# Patient Record
Sex: Female | Born: 1935 | Race: White | Hispanic: No | Smoking: Never smoker
Health system: Southern US, Community
[De-identification: ages and names within clinical notes are randomized; demographics above are authoritative.]

## PROBLEM LIST (undated history)

## (undated) DIAGNOSIS — R197 Diarrhea, unspecified: Secondary | ICD-10-CM

## (undated) DIAGNOSIS — Z8679 Personal history of other diseases of the circulatory system: Secondary | ICD-10-CM

## (undated) DIAGNOSIS — L989 Disorder of the skin and subcutaneous tissue, unspecified: Secondary | ICD-10-CM

## (undated) DIAGNOSIS — Z9889 Other specified postprocedural states: Secondary | ICD-10-CM

## (undated) HISTORY — DX: Other specified postprocedural states: Z86.79

## (undated) HISTORY — DX: Other specified postprocedural states: Z98.890

## (undated) HISTORY — DX: Disorder of the skin and subcutaneous tissue, unspecified: L98.9

## (undated) HISTORY — PX: CATARACT EXTRACTION: SUR2

## (undated) HISTORY — DX: Diarrhea, unspecified: R19.7

## (undated) HISTORY — PX: CHOLECYSTECTOMY: SHX55

---

## 1999-10-03 ENCOUNTER — Inpatient Hospital Stay (HOSPITAL_COMMUNITY): Admission: EM | Admit: 1999-10-03 | Discharge: 1999-10-04 | Payer: Self-pay | Admitting: Internal Medicine

## 1999-10-04 ENCOUNTER — Encounter: Payer: Self-pay | Admitting: Internal Medicine

## 2004-05-06 ENCOUNTER — Ambulatory Visit: Payer: Self-pay | Admitting: Pediatrics

## 2004-06-04 ENCOUNTER — Ambulatory Visit: Payer: Self-pay | Admitting: Obstetrics and Gynecology

## 2004-06-04 ENCOUNTER — Other Ambulatory Visit: Payer: Self-pay

## 2004-06-13 ENCOUNTER — Ambulatory Visit: Payer: Self-pay | Admitting: Obstetrics and Gynecology

## 2006-12-01 ENCOUNTER — Ambulatory Visit: Payer: Self-pay | Admitting: Unknown Physician Specialty

## 2009-01-24 ENCOUNTER — Ambulatory Visit: Payer: Self-pay

## 2009-02-15 ENCOUNTER — Ambulatory Visit: Payer: Self-pay | Admitting: Unknown Physician Specialty

## 2009-02-22 ENCOUNTER — Ambulatory Visit: Payer: Self-pay | Admitting: Unknown Physician Specialty

## 2009-11-09 LAB — HM MAMMOGRAPHY: HM Mammogram: NORMAL

## 2009-11-14 ENCOUNTER — Ambulatory Visit: Payer: Self-pay | Admitting: Internal Medicine

## 2009-12-21 ENCOUNTER — Emergency Department: Payer: Self-pay | Admitting: Internal Medicine

## 2010-02-09 LAB — HM COLONOSCOPY: HM Colonoscopy: ABNORMAL

## 2010-02-14 ENCOUNTER — Other Ambulatory Visit: Payer: Self-pay | Admitting: Physician Assistant

## 2010-03-17 ENCOUNTER — Ambulatory Visit: Payer: Self-pay | Admitting: Unknown Physician Specialty

## 2010-12-11 ENCOUNTER — Encounter: Payer: Self-pay | Admitting: Internal Medicine

## 2010-12-11 ENCOUNTER — Ambulatory Visit: Payer: Self-pay | Admitting: Internal Medicine

## 2010-12-11 ENCOUNTER — Ambulatory Visit (INDEPENDENT_AMBULATORY_CARE_PROVIDER_SITE_OTHER): Payer: Medicare Other | Admitting: Internal Medicine

## 2010-12-11 VITALS — BP 126/67 | HR 61 | Temp 98.2°F | Resp 12 | Ht 66.5 in | Wt 159.0 lb

## 2010-12-11 DIAGNOSIS — K529 Noninfective gastroenteritis and colitis, unspecified: Secondary | ICD-10-CM | POA: Insufficient documentation

## 2010-12-11 DIAGNOSIS — R1031 Right lower quadrant pain: Secondary | ICD-10-CM

## 2010-12-11 DIAGNOSIS — R197 Diarrhea, unspecified: Secondary | ICD-10-CM

## 2010-12-11 LAB — COMPREHENSIVE METABOLIC PANEL
ALT: 16 U/L (ref 0–35)
AST: 20 U/L (ref 0–37)
Albumin: 3.8 g/dL (ref 3.5–5.2)
CO2: 29 mEq/L (ref 19–32)
Calcium: 9 mg/dL (ref 8.4–10.5)
Chloride: 105 mEq/L (ref 96–112)
GFR: 68.77 mL/min (ref >60.00)
Potassium: 4.1 mEq/L (ref 3.5–5.1)
Sodium: 142 mEq/L (ref 135–145)
Total Protein: 6.9 g/dL (ref 6.0–8.3)

## 2010-12-11 LAB — CBC WITH DIFFERENTIAL/PLATELET
Basophils Absolute: 0 10*3/uL (ref 0.0–0.1)
Eosinophils Absolute: 0.1 10*3/uL (ref 0.0–0.7)
Hemoglobin: 13.1 g/dL (ref 12.0–15.0)
Lymphocytes Relative: 28.5 % (ref 12.0–46.0)
MCHC: 33.8 g/dL (ref 30.0–36.0)
Monocytes Relative: 6.9 % (ref 3.0–12.0)
Neutro Abs: 3.7 10*3/uL (ref 1.4–7.7)
Neutrophils Relative %: 63.4 % (ref 43.0–77.0)
Platelets: 184 10*3/uL (ref 150.0–400.0)
RDW: 12.9 % (ref 11.5–14.6)

## 2010-12-11 NOTE — Patient Instructions (Signed)
Labs and CT today. We will call with results. Return in 1 month.

## 2010-12-11 NOTE — Progress Notes (Signed)
Subjective:    Patient ID: Hannah Santos, female    DOB: November 07, 1930, 77 y.o.   MRN: [id number]  Hannah Santos is a 77 year old female who presents to establish care. Her primary concern today is severe right-sided abdominal pain. Abdominal Pain This is a new problem. The current episode started in the past 7 days. The onset quality is gradual. The problem occurs constantly. The problem has been gradually worsening. The pain is located in the RLQ and periumbilical region. The pain is severe. The quality of the pain is aching and colicky. The abdominal pain radiates to the RUQ. Associated symptoms include anorexia and constipation. Pertinent negatives include no arthralgias, diarrhea, dysuria, fever, frequency, headaches, hematochezia, hematuria, melena, myalgias, nausea or vomiting. It is movement what aggravates the pain. The pain is relieved by nothing. She has tried antacids and proton pump inhibitors for the symptoms. The treatment provided no relief. 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 acetaminophen (TYLENOL) 500 MG tablet Take 500 mg by mouth every 6 (six) hours as needed. 2 tablets       . B Complex-C (B-COMPLEX WITH VITAMIN C) tablet Take 1 tablet by mouth daily.        . budesonide (ENTOCORT EC) 3 MG 24 hr capsule Take 3 mg by mouth every morning.        . Cholecalciferol (VITAMIN D3) 1000 UNITS CAPS Take 1 tablet by mouth daily.        Marland Kitchen GLUCOSAMINE-CHONDROITIN PO Take 1 tablet by mouth daily.        Marland Kitchen loratadine (CLARITIN) 10 MG tablet Take 10 mg by mouth daily as needed.        . Omega-3 Fatty Acids (FISH OIL) 1200 MG CAPS Take 1 capsule by mouth daily.        . Simethicone (GAS RELIEF PO) Take 2 tablets by mouth as needed.        Marland Kitchen EVISTA 60 MG tablet       . venlafaxine (EFFEXOR-XR) 150 MG 24 hr capsule         Review of Systems  Constitutional: Positive for fatigue. Negative for  fever, chills, appetite change and unexpected weight change.  HENT: Negative for ear pain, congestion, sore throat, trouble swallowing, neck pain, voice change and sinus pressure.   Eyes: Negative for visual disturbance.  Respiratory: Negative for cough, shortness of breath, wheezing and stridor.   Cardiovascular: Negative for chest pain, palpitations and leg swelling.  Gastrointestinal: Positive for abdominal pain, constipation and anorexia. Negative for nausea, vomiting, diarrhea, blood in stool, melena, hematochezia, abdominal distention and anal bleeding.  Genitourinary: Negative for dysuria, frequency, hematuria and flank pain.  Musculoskeletal: Negative for myalgias, arthralgias and gait problem.  Skin: Negative for color change and rash.  Neurological: Negative for dizziness and headaches.  Hematological: Negative for adenopathy. Does not bruise/bleed easily.  Psychiatric/Behavioral: Negative for suicidal ideas, sleep disturbance and dysphoric mood. The patient is not nervous/anxious.      BP 126/67  Pulse 61  Temp(Src) 98.2 F (36.8 C) (Oral)  Resp 12  Ht 5' 6.5" (1.689 m)  Wt 159 lb (72.122 kg)  BMI 25.28 kg/m2  SpO2 99%     Objective:   Physical Exam  Constitutional: She is oriented to person, place, and time. She appears well-developed and well-nourished. No distress.  HENT:  Head: Normocephalic and atraumatic.  Right Ear: External ear normal.  Left  Ear: External ear normal.  Nose: Nose normal.  Mouth/Throat: Oropharynx is clear and moist. No oropharyngeal exudate.  Eyes: Conjunctivae are normal. Pupils are equal, round, and reactive to light. Right eye exhibits no discharge. Left eye exhibits no discharge. No scleral icterus.  Neck: Normal range of motion. Neck supple. No tracheal deviation present. No thyromegaly present.  Cardiovascular: Normal rate, regular rhythm, normal heart sounds and intact distal pulses.  Exam reveals no gallop and no friction rub.   No  murmur heard. Pulmonary/Chest: Effort normal and breath sounds normal. No respiratory distress. She has no wheezes. She has no rales. She exhibits no tenderness.  Abdominal: Soft. Bowel sounds are normal. She exhibits no distension. There is no hepatosplenomegaly. There is tenderness in the right lower quadrant and periumbilical area. There is rebound, guarding and tenderness at McBurney's point.         Area of tenderness and guarding noted by red circle  Musculoskeletal: Normal range of motion. She exhibits no edema and no tenderness.  Lymphadenopathy:    She has no cervical adenopathy.  Neurological: She is alert and oriented to person, place, and time. No cranial nerve deficit. She exhibits normal muscle tone. Coordination normal.  Skin: Skin is warm and dry. No rash noted. She is not diaphoretic. No erythema. No pallor.  Psychiatric: She has a normal mood and affect. Her behavior is normal. Judgment and thought content normal.          Assessment & Plan:  1. Abdominal pain - abdominal pain and exam findings are concerning for acute appendicitis. We Santos obtain a CBC and CMP with labs today. Patient Santos go to the hospital to have a CT abdomen and pelvis. We Santos call her with results. We Santos obtain records from Dr. Earnest Conroy office in regards to her chronic diarrhea.

## 2010-12-23 ENCOUNTER — Encounter: Payer: Self-pay | Admitting: Internal Medicine

## 2011-01-06 ENCOUNTER — Ambulatory Visit: Payer: Self-pay | Admitting: Ophthalmology

## 2011-01-12 ENCOUNTER — Ambulatory Visit (INDEPENDENT_AMBULATORY_CARE_PROVIDER_SITE_OTHER): Payer: Medicare Other | Admitting: Internal Medicine

## 2011-01-12 ENCOUNTER — Encounter: Payer: Self-pay | Admitting: Internal Medicine

## 2011-01-12 VITALS — BP 132/80 | HR 70 | Temp 97.7°F | Resp 12 | Ht 66.5 in | Wt 156.5 lb

## 2011-01-12 DIAGNOSIS — F03918 Unspecified dementia, unspecified severity, with other behavioral disturbance: Secondary | ICD-10-CM | POA: Insufficient documentation

## 2011-01-12 DIAGNOSIS — F0391 Unspecified dementia with behavioral disturbance: Secondary | ICD-10-CM | POA: Insufficient documentation

## 2011-01-12 DIAGNOSIS — R197 Diarrhea, unspecified: Secondary | ICD-10-CM

## 2011-01-12 DIAGNOSIS — E785 Hyperlipidemia, unspecified: Secondary | ICD-10-CM | POA: Insufficient documentation

## 2011-01-12 DIAGNOSIS — K529 Noninfective gastroenteritis and colitis, unspecified: Secondary | ICD-10-CM

## 2011-01-12 DIAGNOSIS — R413 Other amnesia: Secondary | ICD-10-CM

## 2011-01-12 NOTE — Progress Notes (Signed)
Subjective:    Patient ID: Hannah Santos, female    DOB: [birth year], 77 y.o.   MRN: [id number]  HPI 77YO female presents for follow up. Was recently evaluated for abdominal pain with CT scan, which was normal. Reports abdominal pain hs resolved. Diarrhea improved, however still some loose stools consistent with her baseline. She is primarily concerned today about recent issues with short term memory. Reports difficulty recalling names, events which have recently occurred. No focal neurologic symptoms. First noted memory loss after anesthesia for surgery several years ago.  Outpatient Encounter Prescriptions as of 01/12/2011  Medication Sig Dispense Refill  . acetaminophen (TYLENOL) 500 MG tablet Take 500 mg by mouth every 6 (six) hours as needed. 2 tablets       . B Complex-C (B-COMPLEX WITH VITAMIN C) tablet Take 1 tablet by mouth daily.        . budesonide (ENTOCORT EC) 3 MG 24 hr capsule Take 3 mg by mouth every morning.        . Cholecalciferol (VITAMIN D3) 1000 UNITS CAPS Take 1 tablet by mouth daily.        . DUREZOL 0.05 % EMUL Place 0.5 drops into the left eye 3 (three) times daily.       Marland Kitchen EVISTA 60 MG tablet Take 60 mg by mouth daily.       Marland Kitchen GLUCOSAMINE-CHONDROITIN PO Take 1 tablet by mouth daily.        Marland Kitchen loratadine (CLARITIN) 10 MG tablet Take 10 mg by mouth daily as needed.        Marland Kitchen NEVANAC 0.1 % ophthalmic suspension Place 1 drop into the left eye 3 (three) times daily.       . Omega-3 Fatty Acids (FISH OIL) 1200 MG CAPS Take 1 capsule by mouth daily.        . Simethicone (GAS RELIEF PO) Take 2 tablets by mouth as needed.        . venlafaxine (EFFEXOR-XR) 150 MG 24 hr capsule Take 150 mg by mouth daily.       Marland Kitchen VIGAMOX 0.5 % ophthalmic solution Place 1 drop into the left eye 3 (three) times daily.         Review of Systems  Constitutional: Positive for fatigue. Negative for fever, chills, appetite change and unexpected weight change.  HENT: Negative for ear pain,  congestion, sore throat, trouble swallowing, neck pain, voice change and sinus pressure.   Eyes: Negative for visual disturbance.  Respiratory: Negative for cough, shortness of breath, wheezing and stridor.   Cardiovascular: Negative for chest pain, palpitations and leg swelling.  Gastrointestinal: Positive for diarrhea (chronic). Negative for nausea, vomiting, abdominal pain, constipation, blood in stool, abdominal distention and anal bleeding.  Genitourinary: Negative for dysuria and flank pain.  Musculoskeletal: Negative for myalgias, arthralgias and gait problem.  Skin: Negative for color change and rash.  Neurological: Negative for dizziness and headaches.  Hematological: Negative for adenopathy. Does not bruise/bleed easily.  Psychiatric/Behavioral: Positive for decreased concentration. Negative for suicidal ideas, sleep disturbance and dysphoric mood. The patient is not nervous/anxious.    BP 132/80  Pulse 70  Temp(Src) 97.7 F (36.5 C) (Oral)  Resp 12  Ht 5' 6.5" (1.689 m)  Wt 156 lb 8 oz (70.988 kg)  BMI 24.88 kg/m2  SpO2 100%     Objective:   Physical Exam  Constitutional: She is oriented to person, place, and time. She appears well-developed and well-nourished. No distress.  HENT:  Head: Normocephalic and atraumatic.  Right Ear: External ear normal.  Left Ear: External ear normal.  Nose: Nose normal.  Mouth/Throat: Oropharynx is clear and moist. No oropharyngeal exudate.  Eyes: Conjunctivae are normal. Pupils are equal, round, and reactive to light. Right eye exhibits no discharge. Left eye exhibits no discharge. No scleral icterus.  Neck: Normal range of motion. Neck supple. No tracheal deviation present. No thyromegaly present.  Cardiovascular: Normal rate, regular rhythm, normal heart sounds and intact distal pulses.  Exam reveals no gallop and no friction rub.   No murmur heard. Pulmonary/Chest: Effort normal and breath sounds normal. No respiratory distress. She  has no wheezes. She has no rales. She exhibits no tenderness.  Abdominal: Soft. Bowel sounds are normal. She exhibits no distension and no mass. There is no tenderness. There is no rebound and no guarding.  Musculoskeletal: Normal range of motion. She exhibits no edema and no tenderness.  Lymphadenopathy:    She has no cervical adenopathy.  Neurological: She is alert and oriented to person, place, and time. No cranial nerve deficit. She exhibits normal muscle tone. Coordination normal.  Skin: Skin is warm and dry. No rash noted. She is not diaphoretic. No erythema. No pallor.  Psychiatric: She has a normal mood and affect. Judgment and thought content normal. Her speech is delayed. She is slowed. She exhibits abnormal recent memory.          Assessment & Plan:  1. Memory loss - Will request cognitive testing with Dr. Letta Moynahan. Will check TSH and B12 with labs.  RTC in 1 month.  2. Abdominal pain/chronic diarrhea - Improved, nearly resolved since last visit. Will request records on recent GI evaluation. Will continue budesonide. RTC 1 month.

## 2011-01-15 ENCOUNTER — Encounter: Payer: Self-pay | Admitting: Internal Medicine

## 2011-01-15 ENCOUNTER — Ambulatory Visit (INDEPENDENT_AMBULATORY_CARE_PROVIDER_SITE_OTHER): Payer: Medicare Other | Admitting: Internal Medicine

## 2011-01-15 ENCOUNTER — Other Ambulatory Visit: Payer: Medicare Other

## 2011-01-15 DIAGNOSIS — Z0000 Encounter for general adult medical examination without abnormal findings: Secondary | ICD-10-CM

## 2011-01-15 DIAGNOSIS — E785 Hyperlipidemia, unspecified: Secondary | ICD-10-CM

## 2011-01-15 DIAGNOSIS — R413 Other amnesia: Secondary | ICD-10-CM

## 2011-01-15 LAB — COMPREHENSIVE METABOLIC PANEL WITH GFR
ALT: 16 U/L (ref 0–35)
AST: 21 U/L (ref 0–37)
Albumin: 3.8 g/dL (ref 3.5–5.2)
Alkaline Phosphatase: 78 U/L (ref 39–117)
BUN: 10 mg/dL (ref 6–23)
CO2: 28 meq/L (ref 19–32)
Calcium: 9 mg/dL (ref 8.4–10.5)
Chloride: 106 meq/L (ref 96–112)
Creatinine, Ser: 0.8 mg/dL (ref 0.4–1.2)
GFR: 71.66 mL/min (ref >60.00)
Glucose, Bld: 78 mg/dL (ref 70–99)
Potassium: 3.9 meq/L (ref 3.5–5.1)
Sodium: 142 meq/L (ref 135–145)
Total Bilirubin: 0.5 mg/dL (ref 0.3–1.2)
Total Protein: 6.7 g/dL (ref 6.0–8.3)

## 2011-01-15 LAB — VITAMIN B12: Vitamin B-12: 290 pg/mL (ref 211–911)

## 2011-01-15 LAB — TSH: TSH: 1.33 u[IU]/mL (ref 0.35–5.50)

## 2011-01-15 LAB — LIPID PANEL
Cholesterol: 186 mg/dL (ref 0–200)
LDL Cholesterol: 105 mg/dL — ABNORMAL HIGH (ref 0–99)
Triglycerides: 137 mg/dL (ref 0.0–149.0)

## 2011-01-16 NOTE — Progress Notes (Signed)
Lab only 

## 2011-02-10 ENCOUNTER — Ambulatory Visit: Payer: Self-pay | Admitting: Ophthalmology

## 2011-02-16 ENCOUNTER — Encounter: Payer: Self-pay | Admitting: Internal Medicine

## 2011-02-16 ENCOUNTER — Ambulatory Visit: Payer: Medicare Other

## 2011-02-16 ENCOUNTER — Encounter: Payer: Medicare Other | Admitting: Internal Medicine

## 2011-02-16 ENCOUNTER — Ambulatory Visit (INDEPENDENT_AMBULATORY_CARE_PROVIDER_SITE_OTHER): Payer: Medicare Other | Admitting: Internal Medicine

## 2011-02-16 DIAGNOSIS — R0989 Other specified symptoms and signs involving the circulatory and respiratory systems: Secondary | ICD-10-CM

## 2011-02-16 DIAGNOSIS — Z0000 Encounter for general adult medical examination without abnormal findings: Secondary | ICD-10-CM

## 2011-02-16 DIAGNOSIS — D510 Vitamin B12 deficiency anemia due to intrinsic factor deficiency: Secondary | ICD-10-CM

## 2011-02-16 DIAGNOSIS — M1990 Unspecified osteoarthritis, unspecified site: Secondary | ICD-10-CM

## 2011-02-16 DIAGNOSIS — F329 Major depressive disorder, single episode, unspecified: Secondary | ICD-10-CM

## 2011-02-16 MED ORDER — CYANOCOBALAMIN 1000 MCG/ML IJ SOLN
1000.0000 ug | Freq: Once | INTRAMUSCULAR | Status: AC
  Administered 2011-02-16: 1000 ug via INTRAMUSCULAR

## 2011-02-16 MED ORDER — VENLAFAXINE HCL ER 150 MG PO CP24: 150.0000 mg | ORAL_CAPSULE | Freq: Every day | ORAL | Status: DC

## 2011-02-16 NOTE — Progress Notes (Signed)
Subjective:    Patient ID: Carolyne Fiscal, female    DOB: 11-10-1930, 77 y.o.   MRN: [id number]  HPI  The patient is here for annual Medicare wellness examination and management of other chronic and acute problems.   The risk factors are reflected in the social history.  The roster of all physicians providing medical care to patient - is listed in the Snapshot section of the chart.  Activities of daily living:  The patient is 100% inedpendent in all ADLs: dressing, toileting, feeding as well as independent mobility  Home safety : The patient has smoke detectors in the home. They wear seatbelts. Firearms are present in the home, kept in a safe fashion. There is no violence in the home.   There is no risks for hepatitis, STDs or HIV. There is no history of blood transfusion. They have no travel history to infectious disease endemic areas of the world.  The patient has seen their dentist in the last six month. They have seen their eye doctor in the last year. They admit to hearing difficulty and have not had audiologic testing in the last year.  They do not  have excessive sun exposure. Discussed the need for sun protection: hats, long sleeves and use of sunscreen if there is significant sun exposure.   Diet: the importance of a healthy diet is discussed. They do have a healthy diet.  The patient has a regular exercise program: Walk dog , 30-45 min duration, 7 day per week.  The benefits of regular aerobic exercise were discussed.  Depression screen: there are no signs or vegative symptoms of depression- irritability, change in appetite, anhedonia. Pt does note recent increase in sadness, decreased interest in spending time with friends.  Cognitive assessment: the patient manages all their financial and personal affairs and is actively engaged. They could relate day,date,year and events; recalled 3/3 objects at 3 minutes; performed clock-face test normally.  The following portions of the  patient's history were reviewed and updated as appropriate: allergies, current medications, past family history, past medical history,  past surgical history, past social history  and problem list.  Vision, hearing, body mass index were assessed and reviewed.   During the course of the visit the patient was educated and counseled about appropriate screening and preventive services including : fall prevention , diabetes screening, nutrition counseling, colorectal cancer screening, and recommended immunizations.    Review of Systems  Constitutional: Negative for fever, chills, appetite change, fatigue and unexpected weight change.  HENT: Negative for ear pain, congestion, sore throat, trouble swallowing, neck pain, voice change and sinus pressure.   Eyes: Negative for visual disturbance.  Respiratory: Negative for cough, shortness of breath, wheezing and stridor.   Cardiovascular: Negative for chest pain, palpitations and leg swelling.  Gastrointestinal: Negative for nausea, vomiting, abdominal pain, diarrhea, constipation, blood in stool, abdominal distention and anal bleeding.  Genitourinary: Negative for dysuria and flank pain.  Musculoskeletal: Positive for arthralgias. Negative for myalgias and gait problem.  Skin: Negative for color change and rash.  Neurological: Negative for dizziness and headaches.  Hematological: Negative for adenopathy. Does not bruise/bleed easily.  Psychiatric/Behavioral: Negative for suicidal ideas, sleep disturbance and dysphoric mood. The patient is not nervous/anxious.    BP 122/62  Pulse 87  Temp(Src) 98.4 F (36.9 C) (Oral)  Wt 156 lb (70.761 kg)  SpO2 99%     Objective:   Physical Exam  Constitutional: She is oriented to person, place, and time. She appears well-developed  and well-nourished. No distress.  HENT:  Head: Normocephalic and atraumatic.  Right Ear: External ear normal.  Left Ear: External ear normal.  Nose: Nose normal.  Mouth/Throat:  Oropharynx is clear and moist. No oropharyngeal exudate.  Eyes: Conjunctivae are normal. Pupils are equal, round, and reactive to light. Right eye exhibits no discharge. Left eye exhibits no discharge. No scleral icterus.  Neck: Normal range of motion. Neck supple. Carotid bruit is present (right). No tracheal deviation present. No thyromegaly present.  Cardiovascular: Normal rate, regular rhythm, normal heart sounds and intact distal pulses.  Exam reveals no gallop and no friction rub.   No murmur heard. Pulmonary/Chest: Effort normal and breath sounds normal. No respiratory distress. She has no wheezes. She has no rales. She exhibits no tenderness. Right breast exhibits no inverted nipple, no mass, no nipple discharge, no skin change and no tenderness. Left breast exhibits no inverted nipple, no mass, no nipple discharge, no skin change and no tenderness.    Musculoskeletal: She exhibits no edema and no tenderness.       Right knee: She exhibits decreased range of motion. tenderness found.       Left knee: She exhibits decreased range of motion.       Crepitus bilateral knees  Lymphadenopathy:    She has no cervical adenopathy.  Neurological: She is alert and oriented to person, place, and time. No cranial nerve deficit. She exhibits normal muscle tone. Coordination normal.  Skin: Skin is warm and dry. No rash noted. She is not diaphoretic. No erythema. No pallor.  Psychiatric: She has a normal mood and affect. Her behavior is normal. Judgment and thought content normal.          Assessment & Plan:  1. General exam - Exam normal. Will set up bone density and mammogram. Vaccinations are up to date.  Labwork up to date.  2. Right carotid bruit - Will set up carotid dopplers.  3. Osteoarthritis - Bilateral knees.  Will continue to monitor for now.  4. Pernicious anemia - Will start B12 injections weekly x3 weeks then monthly. Follow up 1 month to see if any improvement in symptoms.  5.  Depression - On effexor. Given recent worsening of symptoms, will supplement B12 as above. If no improvement, then will consider increasing effexor dose at next visit.  6. Osteoporosis -  Pt has been on Evista for years. Having trouble affording med. No recent bone density testing. Will set up bone density test.

## 2011-02-16 NOTE — Progress Notes (Signed)
Addended by: Vernie Murders on: 02/16/2011 06:38 PM   Modules accepted: Orders

## 2011-02-16 NOTE — Patient Instructions (Addendum)
Start weekly Vitamin B12 shots for next three weeks, then monthly. Follow up 1 month.  We will set up: 1. Mammogram 2. Bone Density testing 3. Ultrasound of carotids

## 2011-02-23 ENCOUNTER — Ambulatory Visit (INDEPENDENT_AMBULATORY_CARE_PROVIDER_SITE_OTHER): Payer: Medicare Other | Admitting: *Deleted

## 2011-02-23 DIAGNOSIS — D510 Vitamin B12 deficiency anemia due to intrinsic factor deficiency: Secondary | ICD-10-CM

## 2011-02-23 MED ORDER — CYANOCOBALAMIN 1000 MCG/ML IJ SOLN
1000.0000 ug | Freq: Once | INTRAMUSCULAR | Status: AC
  Administered 2011-02-23: 1000 ug via INTRAMUSCULAR

## 2011-03-02 ENCOUNTER — Ambulatory Visit (INDEPENDENT_AMBULATORY_CARE_PROVIDER_SITE_OTHER): Payer: Medicare Other | Admitting: *Deleted

## 2011-03-02 DIAGNOSIS — E538 Deficiency of other specified B group vitamins: Secondary | ICD-10-CM

## 2011-03-02 MED ORDER — CYANOCOBALAMIN 1000 MCG/ML IJ SOLN
1000.0000 ug | Freq: Once | INTRAMUSCULAR | Status: AC
  Administered 2011-03-02: 1000 ug via INTRAMUSCULAR

## 2011-03-09 ENCOUNTER — Ambulatory Visit (INDEPENDENT_AMBULATORY_CARE_PROVIDER_SITE_OTHER): Payer: Medicare Other | Admitting: *Deleted

## 2011-03-09 ENCOUNTER — Ambulatory Visit: Payer: Medicare Other

## 2011-03-09 DIAGNOSIS — D510 Vitamin B12 deficiency anemia due to intrinsic factor deficiency: Secondary | ICD-10-CM

## 2011-03-09 MED ORDER — CYANOCOBALAMIN 1000 MCG/ML IJ SOLN
1000.0000 ug | Freq: Once | INTRAMUSCULAR | Status: AC
  Administered 2011-03-09: 1000 ug via INTRAMUSCULAR

## 2011-03-16 ENCOUNTER — Ambulatory Visit: Payer: Medicare Other | Admitting: *Deleted

## 2011-03-16 DIAGNOSIS — E538 Deficiency of other specified B group vitamins: Secondary | ICD-10-CM

## 2011-03-16 MED ORDER — CYANOCOBALAMIN 1000 MCG/ML IJ SOLN
1000.0000 ug | Freq: Once | INTRAMUSCULAR | Status: AC
  Administered 2011-03-16: 1000 ug via INTRAMUSCULAR

## 2011-03-18 ENCOUNTER — Ambulatory Visit (INDEPENDENT_AMBULATORY_CARE_PROVIDER_SITE_OTHER): Payer: Medicare Other | Admitting: Internal Medicine

## 2011-03-18 ENCOUNTER — Encounter: Payer: Self-pay | Admitting: Internal Medicine

## 2011-03-18 DIAGNOSIS — D510 Vitamin B12 deficiency anemia due to intrinsic factor deficiency: Secondary | ICD-10-CM

## 2011-03-18 DIAGNOSIS — R413 Other amnesia: Secondary | ICD-10-CM

## 2011-03-18 DIAGNOSIS — F329 Major depressive disorder, single episode, unspecified: Secondary | ICD-10-CM

## 2011-03-18 DIAGNOSIS — M1990 Unspecified osteoarthritis, unspecified site: Secondary | ICD-10-CM | POA: Insufficient documentation

## 2011-03-18 NOTE — Progress Notes (Signed)
Subjective:    Patient ID: Hannah Santos, female    DOB: [birth year], 77 y.o.   MRN: [id number]  HPI 77 year old female with history of pernicious anemia, memory loss, depression, and osteoarthritis presents for followup. She notes some improvement in her memory loss with the use of B12 injections. She also notes some improvement in her energy level. She continues to suffer from depression. She notes significant problems within her family which have exacerbated her depression. She reports full compliance with her Effexor, and does not wish to increase the dose of this medication today. She denies suicidal ideation.  She is concerned today about recent worsening of her memory loss. She notes that she often encounters people in the community who recognize her and she does not recall their names. She is interested in proceeding with cognitive testing at some point in the next year. To date she has not had significant confusion or difficulty with activities of daily living such as bathing herself, driving, or preparing meals. She continues to manage her finances without difficulty.  She is also concerned today about osteoarthritis in her left knee. She was told by orthopedics that she needs a left knee replacement. She has deferred this until 2013. She has been using glucosamine with minimal improvement in her pain.  She did not have carotid Dopplers performed as scheduled after her last visit because she was confused about the time of the appointment.  Outpatient Encounter Prescriptions as of 03/18/2011  Medication Sig Dispense Refill  . acetaminophen (TYLENOL) 500 MG tablet Take 500 mg by mouth every 6 (six) hours as needed. 2 tablets       . B Complex-C (B-COMPLEX WITH VITAMIN C) tablet Take 1 tablet by mouth daily.        . budesonide (ENTOCORT EC) 3 MG 24 hr capsule Take 3 mg by mouth every morning.        . Cholecalciferol (VITAMIN D3) 1000 UNITS CAPS Take 1 tablet by mouth daily.        .  DUREZOL 0.05 % EMUL Place 0.5 drops into the left eye daily.       Marland Kitchen EVISTA 60 MG tablet Take 60 mg by mouth daily.       Marland Kitchen GLUCOSAMINE-CHONDROITIN PO Take 1 tablet by mouth daily.        Marland Kitchen loratadine (CLARITIN) 10 MG tablet Take 10 mg by mouth daily as needed.        . Omega-3 Fatty Acids (FISH OIL) 1200 MG CAPS Take 1 capsule by mouth daily.        . Simethicone (GAS RELIEF PO) Take 2 tablets by mouth as needed.        . venlafaxine (EFFEXOR-XR) 150 MG 24 hr capsule Take 1 capsule (150 mg total) by mouth daily.  30 capsule  6    Review of Systems  Constitutional: Negative for fever, chills, appetite change, fatigue and unexpected weight change.  HENT: Negative for ear pain, congestion, sore throat, trouble swallowing, neck pain, voice change and sinus pressure.   Eyes: Negative for visual disturbance.  Respiratory: Negative for cough, shortness of breath, wheezing and stridor.   Cardiovascular: Negative for chest pain, palpitations and leg swelling.  Gastrointestinal: Negative for nausea, vomiting, abdominal pain, diarrhea, constipation, blood in stool, abdominal distention and anal bleeding.  Genitourinary: Negative for dysuria and flank pain.  Musculoskeletal: Positive for myalgias, joint swelling (left knee) and arthralgias. Negative for gait problem.  Skin: Negative for color change and rash.  Neurological: Negative for dizziness and headaches.  Hematological: Negative for adenopathy. Does not bruise/bleed easily.  Psychiatric/Behavioral: Positive for confusion, dysphoric mood and decreased concentration. Negative for suicidal ideas and sleep disturbance. The patient is not nervous/anxious.    BP 116/60  Pulse 80  Temp(Src) 98 F (36.7 C) (Oral)  Wt 158 lb (71.668 kg)  SpO2 97%     Objective:   Physical Exam  Constitutional: She is oriented to person, place, and time. She appears well-developed and well-nourished. No distress.  HENT:  Head: Normocephalic and atraumatic.    Right Ear: External ear normal.  Left Ear: External ear normal.  Nose: Nose normal.  Mouth/Throat: Oropharynx is clear and moist. No oropharyngeal exudate.  Eyes: Conjunctivae are normal. Pupils are equal, round, and reactive to light. Right eye exhibits no discharge. Left eye exhibits no discharge. No scleral icterus.  Neck: Normal range of motion. Neck supple. Carotid bruit is present (right). No tracheal deviation present. No mass and no thyromegaly present.  Cardiovascular: Normal rate, regular rhythm, normal heart sounds and intact distal pulses.  Exam reveals no gallop and no friction rub.   No murmur heard. Pulmonary/Chest: Effort normal and breath sounds normal. No respiratory distress. She has no wheezes. She has no rales. She exhibits no tenderness.  Musculoskeletal: Normal range of motion. She exhibits no edema and no tenderness.  Lymphadenopathy:    She has no cervical adenopathy.  Neurological: She is alert and oriented to person, place, and time. No cranial nerve deficit. She exhibits normal muscle tone. Coordination normal.  Skin: Skin is warm and dry. No rash noted. She is not diaphoretic. No erythema. No pallor.  Psychiatric: She has a normal mood and affect. Her behavior is normal. Judgment and thought content normal.          Assessment & Plan:   1. Pernicious anemia - Symptoms improved with B12 injections. Will continue monthly injections. Follow up 06/2011.  2. Right carotid bruit - Will set up carotid dopplers. Note that patient missed her previous appointment.  3. Osteoarthritis - Bilateral knees.  Will continue to monitor for now.  4.Depression - On effexor. Pt would prefer not to make changes right now. Will continue to monitor.  5. Memory loss - likely multifactorial with B12 deficiency and depression. We discussed cognitive testing. Patient would like to pursue this early in 2013. We'll plan to have her followup then.

## 2011-03-24 ENCOUNTER — Ambulatory Visit: Payer: Self-pay | Admitting: Internal Medicine

## 2011-03-24 ENCOUNTER — Telehealth: Payer: Self-pay | Admitting: Internal Medicine

## 2011-03-24 NOTE — Telephone Encounter (Signed)
Bone density test shows osteoporosis. I would like to discuss treatment options with her at her next visit. Should be within one month

## 2011-03-25 NOTE — Telephone Encounter (Signed)
Patient informed and scheduled for OV next mth

## 2011-04-09 ENCOUNTER — Encounter: Payer: Self-pay | Admitting: Internal Medicine

## 2011-04-27 ENCOUNTER — Ambulatory Visit (INDEPENDENT_AMBULATORY_CARE_PROVIDER_SITE_OTHER): Payer: Medicare Other | Admitting: Internal Medicine

## 2011-04-27 ENCOUNTER — Encounter: Payer: Self-pay | Admitting: Internal Medicine

## 2011-04-27 VITALS — BP 124/68 | HR 90 | Temp 97.9°F | Wt 150.0 lb

## 2011-04-27 DIAGNOSIS — K5190 Ulcerative colitis, unspecified, without complications: Secondary | ICD-10-CM | POA: Insufficient documentation

## 2011-04-27 DIAGNOSIS — L989 Disorder of the skin and subcutaneous tissue, unspecified: Secondary | ICD-10-CM

## 2011-04-27 DIAGNOSIS — C44329 Squamous cell carcinoma of skin of other parts of face: Secondary | ICD-10-CM | POA: Insufficient documentation

## 2011-04-27 DIAGNOSIS — N390 Urinary tract infection, site not specified: Secondary | ICD-10-CM

## 2011-04-27 LAB — POCT URINALYSIS DIPSTICK
Bilirubin, UA: NEGATIVE
Glucose, UA: 250
Nitrite, UA: POSITIVE
Protein, UA: 30
Urobilinogen, UA: 1
pH, UA: 5

## 2011-04-27 MED ORDER — SULFAMETHOXAZOLE-TRIMETHOPRIM 800-160 MG PO TABS: 1.0000 | ORAL_TABLET | Freq: Two times a day (BID) | ORAL | Status: DC

## 2011-04-27 NOTE — Assessment & Plan Note (Signed)
With positive UA.  Will treat with Septra pending culture data.

## 2011-04-27 NOTE — Progress Notes (Signed)
Subjective:    Patient ID: Hannah Santos, female    DOB: Jan 05, 1931, 78 y.o.   MRN: [id number]  HPI Mrs. Rathe is a 78 year old white female with a history of ulcerative colitis who presents with a three-day history of dysuria accompanied by low back pain of one days duration she attempted to go to the ER for evaluation undertaken but the wait was too long. She's been taking over-the-counter analgesics with no appreciable change.   Past Medical History  Diagnosis Date  . Diarrhea     03/2010, chronic, followed by Dr. Markham Jordan, on budesonide  . Cataract   . S/P ablation of atrial fibrillation     [facility]  . Ulcerative colitis 2011    managed with Entocort, [place]  . Skin lesion of face     repeated frozen by [facility], keeps reappearing   Current Outpatient Prescriptions on File Prior to Visit  Medication Sig Dispense Refill  . acetaminophen (TYLENOL) 500 MG tablet Take 500 mg by mouth every 6 (six) hours as needed. 2 tablets       . B Complex-C (B-COMPLEX WITH VITAMIN C) tablet Take 1 tablet by mouth daily.        . budesonide (ENTOCORT EC) 3 MG 24 hr capsule Take 3 mg by mouth every morning.        . Cholecalciferol (VITAMIN D3) 1000 UNITS CAPS Take 1 tablet by mouth daily.        . DUREZOL 0.05 % EMUL Place 0.5 drops into the left eye daily.       Marland Kitchen EVISTA 60 MG tablet Take 60 mg by mouth daily.       Marland Kitchen GLUCOSAMINE-CHONDROITIN PO Take 1 tablet by mouth daily.        Marland Kitchen loratadine (CLARITIN) 10 MG tablet Take 10 mg by mouth daily as needed.        . Omega-3 Fatty Acids (FISH OIL) 1200 MG CAPS Take 1 capsule by mouth daily.        . Simethicone (GAS RELIEF PO) Take 2 tablets by mouth as needed.        . venlafaxine (EFFEXOR-XR) 150 MG 24 hr capsule Take 1 capsule (150 mg total) by mouth daily.  30 capsule  6        Review of Systems  Constitutional: Negative for fever, chills and unexpected weight change.  HENT: Negative for hearing loss, ear pain, nosebleeds,  congestion, sore throat, facial swelling, rhinorrhea, sneezing, mouth sores, trouble swallowing, neck pain, neck stiffness, voice change, postnasal drip, sinus pressure, tinnitus and ear discharge.   Eyes: Negative for pain, discharge, redness and visual disturbance.  Respiratory: Negative for cough, chest tightness, shortness of breath, wheezing and stridor.   Cardiovascular: Negative for chest pain, palpitations and leg swelling.  Musculoskeletal: Negative for myalgias and arthralgias.  Skin: Negative for color change and rash.  Neurological: Negative for dizziness, weakness, light-headedness and headaches.  Hematological: Negative for adenopathy.       Objective:   Physical Exam  Constitutional: She is oriented to person, place, and time. She appears well-developed and well-nourished.  HENT:  Mouth/Throat: Oropharynx is clear and moist.  Eyes: EOM are normal. Pupils are equal, round, and reactive to light. No scleral icterus.  Neck: Normal range of motion. Neck supple. No JVD present. No thyromegaly present.  Cardiovascular: Normal rate, regular rhythm, normal heart sounds and intact distal pulses.   Pulmonary/Chest: Effort normal and breath sounds normal.  Abdominal: Soft. Bowel sounds are normal.  She exhibits no mass. There is no tenderness.  Musculoskeletal: Normal range of motion. She exhibits no edema.  Lymphadenopathy:    She has no cervical adenopathy.  Neurological: She is alert and oriented to person, place, and time.  Skin: Skin is warm and dry.  Psychiatric: She has a normal mood and affect.          Assessment & Plan:

## 2011-04-27 NOTE — Assessment & Plan Note (Signed)
She is requesting a second opinion on a recurring lesion on her right cheek that has been frozen once.  Refer to Dr. Lin Givens.

## 2011-04-29 LAB — URINE CULTURE

## 2011-05-04 ENCOUNTER — Ambulatory Visit (INDEPENDENT_AMBULATORY_CARE_PROVIDER_SITE_OTHER): Payer: Medicare Other | Admitting: Internal Medicine

## 2011-05-04 ENCOUNTER — Encounter: Payer: Self-pay | Admitting: Internal Medicine

## 2011-05-04 VITALS — BP 124/50 | HR 86 | Temp 98.3°F | Ht 66.0 in | Wt 155.0 lb

## 2011-05-04 DIAGNOSIS — E538 Deficiency of other specified B group vitamins: Secondary | ICD-10-CM

## 2011-05-04 DIAGNOSIS — R143 Flatulence: Secondary | ICD-10-CM

## 2011-05-04 DIAGNOSIS — M810 Age-related osteoporosis without current pathological fracture: Secondary | ICD-10-CM

## 2011-05-04 DIAGNOSIS — R140 Abdominal distension (gaseous): Secondary | ICD-10-CM

## 2011-05-04 DIAGNOSIS — M1990 Unspecified osteoarthritis, unspecified site: Secondary | ICD-10-CM

## 2011-05-04 LAB — COMPREHENSIVE METABOLIC PANEL
ALT: 15 U/L (ref 0–35)
Albumin: 3.8 g/dL (ref 3.5–5.2)
CO2: 27 mEq/L (ref 19–32)
Chloride: 105 mEq/L (ref 96–112)
GFR: 57.61 mL/min — ABNORMAL LOW (ref >60.00)
Glucose, Bld: 59 mg/dL — ABNORMAL LOW (ref 70–99)
Potassium: 3.9 mEq/L (ref 3.5–5.1)
Sodium: 141 mEq/L (ref 135–145)
Total Protein: 6.9 g/dL (ref 6.0–8.3)

## 2011-05-04 MED ORDER — CYANOCOBALAMIN 1000 MCG/ML IJ SOLN
1000.0000 ug | Freq: Once | INTRAMUSCULAR | Status: AC
  Administered 2011-05-04: 1000 ug via INTRAMUSCULAR

## 2011-05-04 NOTE — Assessment & Plan Note (Signed)
Exam remarkable for crepitus in bilateral knees. Patient reports no improvement with steroid injection in the past. Question if she will need knee replacement. Will set up evaluation with orthopedic surgery.

## 2011-05-04 NOTE — Assessment & Plan Note (Signed)
Recent bone density testing showed T score of -4. We discussed potential options for treatment including bisphosphonates. She is not a good candidate for bisphosphonates because of ongoing GI issues. She would like to try Prolia. We will continue insurance coverage for this.

## 2011-05-04 NOTE — Progress Notes (Signed)
Subjective:    Patient ID: Hannah Santos, female    DOB: 03/30/1931, 78 y.o.   MRN: [id number]  HPI 78 year old female with history of chronic diarrhea, pernicious anemia , and osteoarthritis presents for followup. She was recently seen and evaluated for urinary tract infection. She reports that her symptoms have completely resolved. She denies any recent fever, chills, flank pain, dysuria, frequency.  In regards to her chronic diarrhea, she notes significant improvement with the use of budesonide. However, she has noticed some increased bloating and abdominal distention. She denies any abdominal pain. She denies any blood in her stool. She denies any nausea or vomiting. She has been discussing with her GI physician about tapering down on her use of budesonide because of the symptoms she experiences.  She is also concerned today about bilateral knee pain. She notes that she was told in the past that her right knee is bone-on-bone. She reports that she had steroid injections in the past but had no improvement with this. She is interested in talking to an orthopedic surgeon again about potential knee replacement.  She is also concerned today about recent bone density testing. Notably, her T score was -4 consistent with osteoporosis. She has not previously taken medications to help with bone density. She has not had any spontaneous fractures.  Outpatient Encounter Prescriptions as of 05/04/2011  Medication Sig Dispense Refill  . acetaminophen (TYLENOL) 500 MG tablet Take 500 mg by mouth every 6 (six) hours as needed. 2 tablets       . B Complex-C (B-COMPLEX WITH VITAMIN C) tablet Take 1 tablet by mouth daily.        . budesonide (ENTOCORT EC) 3 MG 24 hr capsule Take 3 mg by mouth every morning.        . Cholecalciferol (VITAMIN D3) 1000 UNITS CAPS Take 1 tablet by mouth daily.        . DUREZOL 0.05 % EMUL Place 0.5 drops into the left eye daily.       Marland Kitchen EVISTA 60 MG tablet Take 60 mg by mouth  daily.       Marland Kitchen GLUCOSAMINE-CHONDROITIN PO Take 1 tablet by mouth daily.        Marland Kitchen loratadine (CLARITIN) 10 MG tablet Take 10 mg by mouth daily as needed.        . Omega-3 Fatty Acids (FISH OIL) 1200 MG CAPS Take 1 capsule by mouth daily.        . Simethicone (GAS RELIEF PO) Take 2 tablets by mouth as needed.        . venlafaxine (EFFEXOR-XR) 150 MG 24 hr capsule Take 1 capsule (150 mg total) by mouth daily.  30 capsule  6  . DISCONTD: sulfamethoxazole-trimethoprim (SEPTRA DS) 800-160 MG per tablet Take 1 tablet by mouth 2 (two) times daily.  10 tablet  0   Facility-Administered Encounter Medications as of 05/04/2011  Medication Dose Route Frequency Provider Last Rate Last Dose  . cyanocobalamin ((VITAMIN B-12)) injection 1,000 mcg  1,000 mcg Intramuscular Once Shelia Media, MD   1,000 mcg at 05/04/11 1100    Review of Systems  Constitutional: Negative for fever, chills, appetite change, fatigue and unexpected weight change.  HENT: Negative for ear pain, congestion, sore throat, trouble swallowing, neck pain, voice change and sinus pressure.   Eyes: Negative for visual disturbance.  Respiratory: Negative for cough, shortness of breath, wheezing and stridor.   Cardiovascular: Negative for chest pain, palpitations and leg swelling.  Gastrointestinal: Positive for  abdominal pain and abdominal distention. Negative for nausea, vomiting, diarrhea, constipation, blood in stool and anal bleeding.  Genitourinary: Negative for dysuria and flank pain.  Musculoskeletal: Positive for myalgias and arthralgias. Negative for gait problem.  Skin: Negative for color change and rash.  Neurological: Negative for dizziness and headaches.  Hematological: Negative for adenopathy. Does not bruise/bleed easily.  Psychiatric/Behavioral: Negative for suicidal ideas, sleep disturbance and dysphoric mood. The patient is not nervous/anxious.    BP 124/50  Pulse 86  Temp(Src) 98.3 F (36.8 C) (Oral)  Ht 5\' 6"   (1.676 m)  Wt 155 lb (70.308 kg)  BMI 25.02 kg/m2  SpO2 98%     Objective:   Physical Exam  Constitutional: She is oriented to person, place, and time. She appears well-developed and well-nourished. No distress.  HENT:  Head: Normocephalic and atraumatic.  Right Ear: External ear normal.  Left Ear: External ear normal.  Nose: Nose normal.  Mouth/Throat: Oropharynx is clear and moist. No oropharyngeal exudate.  Eyes: Conjunctivae are normal. Pupils are equal, round, and reactive to light. Right eye exhibits no discharge. Left eye exhibits no discharge. No scleral icterus.  Neck: Normal range of motion. Neck supple. No tracheal deviation present. No thyromegaly present.  Cardiovascular: Normal rate, regular rhythm, normal heart sounds and intact distal pulses.  Exam reveals no gallop and no friction rub.   No murmur heard. Pulmonary/Chest: Effort normal and breath sounds normal. No respiratory distress. She has no wheezes. She has no rales. She exhibits no tenderness.  Abdominal: Soft. Bowel sounds are normal. She exhibits no distension and no mass. There is no tenderness. There is no rebound and no guarding.  Musculoskeletal: She exhibits no edema and no tenderness.       Right knee: She exhibits decreased range of motion.       Left knee: She exhibits decreased range of motion.       Crepitus bilateral kness  Lymphadenopathy:    She has no cervical adenopathy.  Neurological: She is alert and oriented to person, place, and time. No cranial nerve deficit. She exhibits normal muscle tone. Coordination normal.  Skin: Skin is warm and dry. No rash noted. She is not diaphoretic. No erythema. No pallor.  Psychiatric: She has a normal mood and affect. Her behavior is normal. Judgment and thought content normal.          Assessment & Plan:

## 2011-05-04 NOTE — Assessment & Plan Note (Signed)
Will continue with monthly B12 shots this patient has had significant improvement in her energy level and memory with this.

## 2011-05-04 NOTE — Assessment & Plan Note (Signed)
Most likely secondary to use of budesonide, however will check H. pylori. She will taper budesonide to every other day. She has followup with GI medicine in 3 months. Patient will followup here in one month.

## 2011-05-07 LAB — HELICOBACTER PYLORI  ANTIBODY, IGM: Helicobacter pylori, IgM: 2.6 U/mL (ref <9.0)

## 2011-05-13 ENCOUNTER — Telehealth: Payer: Self-pay | Admitting: Internal Medicine

## 2011-05-13 NOTE — Telephone Encounter (Signed)
I called patient to let her know that Prolia was authorized by her insurance company but she would need to pay $147 for the injection.  She wasn't sure what Prolia was for I tried to explain it to her, not sure I did a good job.  She then stated that she needed to hold off since she had a $400 bill already where Medicare wasn't paying all of her office visits.  I gave her the number to our billing department so she could call and make sure they were filing both insurances.

## 2011-05-14 NOTE — Telephone Encounter (Signed)
Pt has f/u OV in March, it can be discussed further at that OV or scheduled for nurse visit if pt calls after discussing with billing

## 2011-06-05 ENCOUNTER — Encounter: Payer: Self-pay | Admitting: Internal Medicine

## 2011-06-05 ENCOUNTER — Ambulatory Visit (INDEPENDENT_AMBULATORY_CARE_PROVIDER_SITE_OTHER): Payer: Medicare Other | Admitting: Internal Medicine

## 2011-06-05 VITALS — BP 132/56 | HR 76 | Temp 98.1°F | Wt 154.0 lb

## 2011-06-05 DIAGNOSIS — K5190 Ulcerative colitis, unspecified, without complications: Secondary | ICD-10-CM

## 2011-06-05 DIAGNOSIS — M810 Age-related osteoporosis without current pathological fracture: Secondary | ICD-10-CM

## 2011-06-05 DIAGNOSIS — L989 Disorder of the skin and subcutaneous tissue, unspecified: Secondary | ICD-10-CM

## 2011-06-05 DIAGNOSIS — D510 Vitamin B12 deficiency anemia due to intrinsic factor deficiency: Secondary | ICD-10-CM

## 2011-06-05 MED ORDER — CYANOCOBALAMIN 1000 MCG/ML IJ SOLN
1000.0000 ug | Freq: Once | INTRAMUSCULAR | Status: AC
  Administered 2011-06-05: 1000 ug via INTRAMUSCULAR

## 2011-06-05 NOTE — Progress Notes (Signed)
Subjective:    Patient ID: Hannah Santos, female    DOB: May 24, 1930, 78 y.o.   MRN: [id number]  HPI 78 year old female with a history of pernicious anemia, depression, ulcerative colitis, and osteoporosis presents for followup. In regards to her ulcer colitis, she has had complete resolution of her symptoms of diarrhea with the use of budesonide. She has tapered her budesonide dose to one pill daily. She is now having some constipation. She would like to try weaning her dose further to every other day. She denies any abdominal pain, blood in her stool, or other concerns.  In regards to her history of osteoporosis, recent bone density testing showed T score of -4. Our office evaluated her insurance coverage for Prolia given that she is not a good candidate for bisphosphonates because of ongoing GI issues. We discussed starting this medication today.  In regards to pernicious anemia, she reports her energy level is much improved since starting B12 shots. She would like to have her daughter give her B12 shots at home.  She notes that she recently had resection of lesion on her right cheek. Biopsy was consistent with squamous cell carcinoma. Her dermatologist did a wide excision. She reports that the wound is healing well.  Outpatient Encounter Prescriptions as of 06/05/2011  Medication Sig Dispense Refill  . acetaminophen (TYLENOL) 500 MG tablet Take 500 mg by mouth every 6 (six) hours as needed. 2 tablets       . B Complex-C (B-COMPLEX WITH VITAMIN C) tablet Take 1 tablet by mouth daily.        . budesonide (ENTOCORT EC) 3 MG 24 hr capsule Take 3 mg by mouth every morning.        . Cholecalciferol (VITAMIN D3) 1000 UNITS CAPS Take 1 tablet by mouth daily.        . DUREZOL 0.05 % EMUL Place 0.5 drops into the left eye daily.       Marland Kitchen EVISTA 60 MG tablet Take 60 mg by mouth daily.       Marland Kitchen GLUCOSAMINE-CHONDROITIN PO Take 1 tablet by mouth daily.        Marland Kitchen loratadine (CLARITIN) 10 MG tablet Take 10 mg  by mouth daily as needed.        . Omega-3 Fatty Acids (FISH OIL) 1200 MG CAPS Take 1 capsule by mouth daily.        . Simethicone (GAS RELIEF PO) Take 2 tablets by mouth as needed.        . venlafaxine (EFFEXOR-XR) 150 MG 24 hr capsule Take 1 capsule (150 mg total) by mouth daily.  30 capsule  6  . LORazepam (ATIVAN) 0.5 MG tablet        Facility-Administered Encounter Medications as of 06/05/2011  Medication Dose Route Frequency Provider Last Rate Last Dose  . cyanocobalamin ((VITAMIN B-12)) injection 1,000 mcg  1,000 mcg Intramuscular Once Shelia Media, MD   1,000 mcg at 06/05/11 1604    Review of Systems  Constitutional: Negative for fever, chills, appetite change, fatigue and unexpected weight change.  HENT: Negative for ear pain, congestion, sore throat, trouble swallowing, neck pain, voice change and sinus pressure.   Eyes: Negative for visual disturbance.  Respiratory: Negative for cough, shortness of breath, wheezing and stridor.   Cardiovascular: Negative for chest pain, palpitations and leg swelling.  Gastrointestinal: Positive for constipation. Negative for nausea, vomiting, abdominal pain, diarrhea, blood in stool, abdominal distention and anal bleeding.  Genitourinary: Negative for dysuria and flank pain.  Musculoskeletal: Negative for myalgias, arthralgias and gait problem.  Skin: Negative for color change and rash.  Neurological: Negative for dizziness and headaches.  Hematological: Negative for adenopathy. Does not bruise/bleed easily.  Psychiatric/Behavioral: Positive for dysphoric mood and decreased concentration. Negative for suicidal ideas and sleep disturbance. The patient is not nervous/anxious.    BP 132/56  Pulse 76  Temp(Src) 98.1 F (36.7 C) (Oral)  Wt 154 lb (69.854 kg)  SpO2 100%     Objective:   Physical Exam  Constitutional: She is oriented to person, place, and time. She appears well-developed and well-nourished. No distress.  HENT:  Head:  Normocephalic and atraumatic.  Right Ear: External ear normal.  Left Ear: External ear normal.  Nose: Nose normal.  Mouth/Throat: Oropharynx is clear and moist. No oropharyngeal exudate.  Eyes: Conjunctivae are normal. Pupils are equal, round, and reactive to light. Right eye exhibits no discharge. Left eye exhibits no discharge. No scleral icterus.  Neck: Normal range of motion. Neck supple. No tracheal deviation present. No thyromegaly present.  Cardiovascular: Normal rate, regular rhythm, normal heart sounds and intact distal pulses.  Exam reveals no gallop and no friction rub.   No murmur heard. Pulmonary/Chest: Effort normal and breath sounds normal. No respiratory distress. She has no wheezes. She has no rales. She exhibits no tenderness.  Abdominal: Soft. Bowel sounds are normal. She exhibits no distension and no mass. There is no tenderness. There is no rebound and no guarding.  Musculoskeletal: Normal range of motion. She exhibits no edema and no tenderness.  Lymphadenopathy:    She has no cervical adenopathy.  Neurological: She is alert and oriented to person, place, and time. No cranial nerve deficit. She exhibits normal muscle tone. Coordination normal.  Skin: Skin is warm and dry. No rash noted. She is not diaphoretic. No erythema. No pallor.     Psychiatric: She has a normal mood and affect. Her speech is normal and behavior is normal. Judgment and thought content normal.          Assessment & Plan:

## 2011-06-05 NOTE — Assessment & Plan Note (Signed)
Improved with budesonide. She has now tapered to one pill daily. She is having some constipation. She will taper to one pill every other day. She will call if any recurrence of diarrhea, abdominal pain, or other complaints. Otherwise, she will followup in 3 months.

## 2011-06-05 NOTE — Assessment & Plan Note (Signed)
Will get B12 shot today. Patient would like to have daughter gets B12 shots in the future. They will come in for teaching prior to starting this.

## 2011-06-05 NOTE — Assessment & Plan Note (Signed)
Will plan to start Prolia q89mo. We will call her when medication is available. Bone density testing showed T score of -4. She is not a good candidate for bisphosphonate because of ongoing GI issues and also to colitis.

## 2011-06-05 NOTE — Assessment & Plan Note (Signed)
Biopsy of the skin lesion on her face was consistent with squamous cell carcinoma. She is now status post wide excision. Will obtain reports from her dermatologist.

## 2011-06-23 ENCOUNTER — Ambulatory Visit (INDEPENDENT_AMBULATORY_CARE_PROVIDER_SITE_OTHER): Payer: Medicare Other | Admitting: Internal Medicine

## 2011-06-23 ENCOUNTER — Encounter: Payer: Self-pay | Admitting: Internal Medicine

## 2011-06-23 VITALS — BP 132/60 | HR 87 | Temp 98.2°F | Wt 155.0 lb

## 2011-06-23 DIAGNOSIS — F32A Depression, unspecified: Secondary | ICD-10-CM

## 2011-06-23 DIAGNOSIS — M1990 Unspecified osteoarthritis, unspecified site: Secondary | ICD-10-CM

## 2011-06-23 DIAGNOSIS — K5190 Ulcerative colitis, unspecified, without complications: Secondary | ICD-10-CM

## 2011-06-23 DIAGNOSIS — R300 Dysuria: Secondary | ICD-10-CM

## 2011-06-23 DIAGNOSIS — F329 Major depressive disorder, single episode, unspecified: Secondary | ICD-10-CM

## 2011-06-23 DIAGNOSIS — I739 Peripheral vascular disease, unspecified: Secondary | ICD-10-CM | POA: Insufficient documentation

## 2011-06-23 DIAGNOSIS — M810 Age-related osteoporosis without current pathological fracture: Secondary | ICD-10-CM

## 2011-06-23 LAB — POCT URINALYSIS DIPSTICK
Bilirubin, UA: NEGATIVE
Blood, UA: NEGATIVE
Glucose, UA: NEGATIVE
Ketones, UA: NEGATIVE
pH, UA: 7

## 2011-06-23 MED ORDER — "SYRINGE/NEEDLE (DISP) 25G X 5/8"" 3 ML MISC": Status: DC

## 2011-06-23 MED ORDER — VENLAFAXINE HCL ER 150 MG PO CP24: 150.0000 mg | ORAL_CAPSULE | Freq: Every day | ORAL | Status: DC

## 2011-06-23 MED ORDER — VENLAFAXINE HCL ER 37.5 MG PO CP24: 37.5000 mg | ORAL_CAPSULE | Freq: Every day | ORAL | Status: DC

## 2011-06-23 MED ORDER — CYANOCOBALAMIN 1000 MCG/ML IJ SOLN: 1000.0000 ug | INTRAMUSCULAR | Status: AC

## 2011-06-23 MED ORDER — DENOSUMAB 60 MG/ML ~~LOC~~ SOLN
60.0000 mg | Freq: Once | SUBCUTANEOUS | Status: AC
  Administered 2011-06-23: 60 mg via SUBCUTANEOUS

## 2011-06-23 NOTE — Assessment & Plan Note (Signed)
Will check urinalysis today and treat as appropriate.

## 2011-06-23 NOTE — Patient Instructions (Signed)
Start taking Calcium supplement with 1200mg  daily total.  Follow up 3 weeks.

## 2011-06-23 NOTE — Assessment & Plan Note (Signed)
Evaluation with orthopedics pending.

## 2011-06-23 NOTE — Assessment & Plan Note (Signed)
Will set up for evaluation with arterial doppler. Pt has visit with vascular surgery today, so will see if this can be added/scheduled.

## 2011-06-23 NOTE — Progress Notes (Signed)
Subjective:    Patient ID: Carolyne Fiscal, female    DOB: 07-Oct-1930, 78 y.o.   MRN: [id number]  HPI 78YO female with h/o depression, memory loss, ulcerative colitis, and osteoporosis presents for follow up.  She is concerned today about recent flank pain and dysuria x 2 days.  She denies flank pain, fever, or chills.  She has not taken any medication for this.  She is also concerned about her ulcerative colitis. She reports that she has tapered her budesonide to every other day but is continuing to have constipation that requires use of milk of magnesia. She notes that she is planning to see her GI physician today.  She is also concerned today about some pain in her calves and feet mostly at night. The pain is described as cramping in the back of her calves. During these times her legs are noted to be cool to touch and purple or red in color. She notes that she has appointment with vascular surgery today and plans to ask for ultrasound evaluation.  She is also concerned today about recent increase in her depressed mood. She reports that she initially had improvement with Effexor but this has weaned off over the last few years. She would like to try increasing the dose of this medication.  Outpatient Encounter Prescriptions as of 06/23/2011  Medication Sig Dispense Refill  . acetaminophen (TYLENOL) 500 MG tablet Take 500 mg by mouth every 6 (six) hours as needed. 2 tablets       . B Complex-C (B-COMPLEX WITH VITAMIN C) tablet Take 1 tablet by mouth daily.        . budesonide (ENTOCORT EC) 3 MG 24 hr capsule Take 3 mg by mouth every morning.        . Cholecalciferol (VITAMIN D3) 1000 UNITS CAPS Take 1 tablet by mouth daily.        . DUREZOL 0.05 % EMUL Place 0.5 drops into the left eye daily.       Marland Kitchen EVISTA 60 MG tablet Take 60 mg by mouth daily.       Marland Kitchen GLUCOSAMINE-CHONDROITIN PO Take 1 tablet by mouth daily.        Marland Kitchen loratadine (CLARITIN) 10 MG tablet Take 10 mg by mouth daily as needed.         Marland Kitchen LORazepam (ATIVAN) 0.5 MG tablet       . Omega-3 Fatty Acids (FISH OIL) 1200 MG CAPS Take 1 capsule by mouth daily.        . Simethicone (GAS RELIEF PO) Take 2 tablets by mouth as needed.        . venlafaxine (EFFEXOR-XR) 150 MG 24 hr capsule Take 1 capsule (150 mg total) by mouth daily.  30 capsule  6  . DISCONTD: venlafaxine (EFFEXOR-XR) 150 MG 24 hr capsule Take 1 capsule (150 mg total) by mouth daily.  30 capsule  6  . cyanocobalamin (,VITAMIN B-12,) 1000 MCG/ML injection Inject 1 mL (1,000 mcg total) into the skin every 30 (thirty) days.  10 mL  1  . SYRINGE-NEEDLE, DISP, 3 ML (B-D [employer]RA SYRINGE) 25G X 5/8" 3 ML MISC Use with b12  50 each  0  . venlafaxine (EFFEXOR XR) 37.5 MG 24 hr capsule Take 1 capsule (37.5 mg total) by mouth daily.  30 capsule  6   Facility-Administered Encounter Medications as of 06/23/2011  Medication Dose Route Frequency Provider Last Rate Last Dose  . denosumab (PROLIA) injection 60 mg  60 mg Subcutaneous Once  Shelia Media, MD   60 mg at 06/23/11 1147     Review of Systems  Constitutional: Negative for fever, chills, appetite change, fatigue and unexpected weight change.  HENT: Negative for ear pain, congestion, sore throat, trouble swallowing, neck pain, voice change and sinus pressure.   Eyes: Negative for visual disturbance.  Respiratory: Negative for cough, shortness of breath, wheezing and stridor.   Cardiovascular: Negative for chest pain, palpitations and leg swelling.  Gastrointestinal: Positive for constipation. Negative for nausea, vomiting, abdominal pain, diarrhea, blood in stool, abdominal distention and anal bleeding.  Genitourinary: Positive for dysuria. Negative for urgency, frequency and flank pain.  Musculoskeletal: Positive for myalgias. Negative for arthralgias and gait problem.  Skin: Positive for color change. Negative for rash.  Neurological: Negative for dizziness and headaches.  Hematological: Negative for adenopathy. Does  not bruise/bleed easily.  Psychiatric/Behavioral: Positive for dysphoric mood and decreased concentration. Negative for suicidal ideas and sleep disturbance. The patient is nervous/anxious.    BP 132/60  Pulse 87  Temp(Src) 98.2 F (36.8 C) (Oral)  Wt 155 lb (70.308 kg)  SpO2 100%     Objective:   Physical Exam  Constitutional: She is oriented to person, place, and time. She appears well-developed and well-nourished. No distress.  HENT:  Head: Normocephalic and atraumatic.  Right Ear: External ear normal.  Left Ear: External ear normal.  Nose: Nose normal.  Mouth/Throat: Oropharynx is clear and moist. No oropharyngeal exudate.  Eyes: Conjunctivae are normal. Pupils are equal, round, and reactive to light. Right eye exhibits no discharge. Left eye exhibits no discharge. No scleral icterus.  Neck: Normal range of motion. Neck supple. No tracheal deviation present. No thyromegaly present.  Cardiovascular: Normal rate, regular rhythm, normal heart sounds and intact distal pulses.  Exam reveals no gallop and no friction rub.   No murmur heard. Pulmonary/Chest: Effort normal and breath sounds normal. No respiratory distress. She has no wheezes. She has no rales. She exhibits no tenderness.  Abdominal: Soft. Bowel sounds are normal. She exhibits no distension and no mass. There is no tenderness. There is no rebound and no guarding.  Musculoskeletal: Normal range of motion. She exhibits no edema and no tenderness.  Lymphadenopathy:    She has no cervical adenopathy.  Neurological: She is alert and oriented to person, place, and time. No cranial nerve deficit. She exhibits normal muscle tone. Coordination normal.  Skin: Skin is warm and dry. No rash noted. She is not diaphoretic. No erythema. No pallor.  Psychiatric: Her speech is normal and behavior is normal. Judgment and thought content normal. She exhibits a depressed mood.          Assessment & Plan:

## 2011-06-23 NOTE — Assessment & Plan Note (Signed)
Now having some constipation.  Has follow up with GI today to discuss limiting dose of Budesonide.

## 2011-06-23 NOTE — Assessment & Plan Note (Signed)
Will start Prolia today. T-score -4 on recent testing. Not a candidate for bisphosphonates because of GI issues. Next prolia 12/2011.

## 2011-06-23 NOTE — Progress Notes (Signed)
Addended by: Jobie Quaker on: 06/23/2011 04:45 PM   Modules accepted: Orders

## 2011-06-23 NOTE — Assessment & Plan Note (Signed)
Symptoms worsening. Will try adding 37.5mg  Effexor to current dose for total 187.5mg  daily to see if any improvement.  If no improvement, would increase to 225mg  daily as max.  Follow up 1 month.

## 2011-06-24 ENCOUNTER — Other Ambulatory Visit: Payer: Self-pay | Admitting: *Deleted

## 2011-06-24 MED ORDER — CIPROFLOXACIN HCL 250 MG PO TABS: 250.0000 mg | ORAL_TABLET | Freq: Two times a day (BID) | ORAL | Status: AC

## 2011-06-24 NOTE — Telephone Encounter (Signed)
, °

## 2011-06-25 LAB — URINE CULTURE

## 2011-07-08 ENCOUNTER — Ambulatory Visit: Payer: Medicare Other

## 2011-07-14 ENCOUNTER — Ambulatory Visit (INDEPENDENT_AMBULATORY_CARE_PROVIDER_SITE_OTHER): Payer: Medicare Other | Admitting: Internal Medicine

## 2011-07-14 ENCOUNTER — Encounter: Payer: Self-pay | Admitting: Internal Medicine

## 2011-07-14 VITALS — BP 131/78 | HR 74 | Temp 98.7°F | Ht 66.0 in | Wt 154.5 lb

## 2011-07-14 DIAGNOSIS — F329 Major depressive disorder, single episode, unspecified: Secondary | ICD-10-CM

## 2011-07-14 DIAGNOSIS — M1990 Unspecified osteoarthritis, unspecified site: Secondary | ICD-10-CM

## 2011-07-14 DIAGNOSIS — D510 Vitamin B12 deficiency anemia due to intrinsic factor deficiency: Secondary | ICD-10-CM

## 2011-07-14 DIAGNOSIS — K5190 Ulcerative colitis, unspecified, without complications: Secondary | ICD-10-CM

## 2011-07-14 NOTE — Progress Notes (Signed)
Subjective:    Patient ID: Hannah Santos, female    DOB: [birth year], 78 y.o.   MRN: [id number]  HPI 78 year old female with history of depression, pernicious anemia, memory loss, osteoarthritis, ulcerative colitis presents for followup. She generally reports she is doing well. She notes that she is feeling more like herself after increasing her dose of Effexor. She notes that family members have noted this as well. She reports that she is tolerating B12 injections monthly without any complications. She notes that her also to her colitis symptoms are well-controlled with every other day use of budesonide. She notes that her GI physician recently encouraged her to try to taper down as much as possible on the use of this medication. She also notes that she was recently seen by orthopedic surgeon who told her she is not a candidate for joint replacement. She continues to use Tylenol for her chronic pain in her knees with some improvement.  Outpatient Encounter Prescriptions as of 07/14/2011  Medication Sig Dispense Refill  . acetaminophen (TYLENOL) 500 MG tablet Take 500 mg by mouth every 6 (six) hours as needed. 2 tablets       . B Complex-C (B-COMPLEX WITH VITAMIN C) tablet Take 1 tablet by mouth daily.        . budesonide (ENTOCORT EC) 3 MG 24 hr capsule Take 3 mg by mouth every morning.        . Cholecalciferol (VITAMIN D3) 1000 UNITS CAPS Take 1 tablet by mouth daily.        . cyanocobalamin (,VITAMIN B-12,) 1000 MCG/ML injection Inject 1 mL (1,000 mcg total) into the skin every 30 (thirty) days.  10 mL  1  . DUREZOL 0.05 % EMUL Place 0.5 drops into the left eye daily.       Marland Kitchen GLUCOSAMINE-CHONDROITIN PO Take 1 tablet by mouth daily.        Marland Kitchen loratadine (CLARITIN) 10 MG tablet Take 10 mg by mouth daily as needed.        Marland Kitchen LORazepam (ATIVAN) 0.5 MG tablet 3 (three) times daily as needed.       . Omega-3 Fatty Acids (FISH OIL) 1200 MG CAPS Take 1 capsule by mouth daily.        . Simethicone (GAS  RELIEF PO) Take 2 tablets by mouth as needed.        . SYRINGE-NEEDLE, DISP, 3 ML (B-D INTEGRA SYRINGE) 25G X 5/8" 3 ML MISC Use with b12  50 each  0  . venlafaxine (EFFEXOR XR) 37.5 MG 24 hr capsule Take 1 capsule (37.5 mg total) by mouth daily.  30 capsule  6  . venlafaxine (EFFEXOR-XR) 150 MG 24 hr capsule Take 1 capsule (150 mg total) by mouth daily.  30 capsule  6  . EVISTA 60 MG tablet Take 60 mg by mouth daily.         Review of Systems  Constitutional: Negative for fever, chills, appetite change, fatigue and unexpected weight change.  HENT: Negative for ear pain, congestion, sore throat, trouble swallowing, neck pain, voice change and sinus pressure.   Eyes: Negative for visual disturbance.  Respiratory: Negative for cough, shortness of breath, wheezing and stridor.   Cardiovascular: Negative for chest pain, palpitations and leg swelling.  Gastrointestinal: Negative for nausea, vomiting, abdominal pain, diarrhea, constipation, blood in stool, abdominal distention and anal bleeding.  Genitourinary: Negative for dysuria and flank pain.  Musculoskeletal: Positive for myalgias, back pain and arthralgias. Negative for gait problem.  Skin: Negative  for color change and rash.  Neurological: Negative for dizziness and headaches.  Hematological: Negative for adenopathy. Does not bruise/bleed easily.  Psychiatric/Behavioral: Negative for suicidal ideas, sleep disturbance and dysphoric mood. The patient is not nervous/anxious.    BP 131/78  Pulse 74  Temp(Src) 98.7 F (37.1 C) (Oral)  Ht 5\' 6"  (1.676 m)  Wt 154 lb 8 oz (70.081 kg)  BMI 24.94 kg/m2  SpO2 100%     Objective:   Physical Exam  Constitutional: She is oriented to person, place, and time. She appears well-developed and well-nourished. No distress.  HENT:  Head: Normocephalic and atraumatic.  Right Ear: External ear normal.  Left Ear: External ear normal.  Nose: Nose normal.  Mouth/Throat: Oropharynx is clear and moist.  No oropharyngeal exudate.  Eyes: Conjunctivae are normal. Pupils are equal, round, and reactive to light. Right eye exhibits no discharge. Left eye exhibits no discharge. No scleral icterus.  Neck: Normal range of motion. Neck supple. No tracheal deviation present. No thyromegaly present.  Cardiovascular: Normal rate, regular rhythm, normal heart sounds and intact distal pulses.  Exam reveals no gallop and no friction rub.   No murmur heard. Pulmonary/Chest: Effort normal and breath sounds normal. No respiratory distress. She has no wheezes. She has no rales. She exhibits no tenderness.  Musculoskeletal: Normal range of motion. She exhibits no edema and no tenderness.  Lymphadenopathy:    She has no cervical adenopathy.  Neurological: She is alert and oriented to person, place, and time. No cranial nerve deficit. She exhibits normal muscle tone. Coordination normal.  Skin: Skin is warm and dry. No rash noted. She is not diaphoretic. No erythema. No pallor.  Psychiatric: She has a normal mood and affect. Her behavior is normal. Judgment and thought content normal.          Assessment & Plan:

## 2011-07-14 NOTE — Assessment & Plan Note (Signed)
Symptoms improved with supplementation of B12. We'll plan to continue monthly B12 shots. Followup in 3 months.

## 2011-07-14 NOTE — Assessment & Plan Note (Signed)
Patient was recently told she is not a candidate for knee replacement. We'll continue Tylenol as needed for pain. She will continue to follow with orthopedics as needed.

## 2011-07-14 NOTE — Assessment & Plan Note (Signed)
Symptoms improved with increase in dose of Effexor. We'll plan to continue. Followup in 3 months.

## 2011-07-14 NOTE — Assessment & Plan Note (Signed)
Symptoms are well controlled with every other day use of budesonide. We'll continue to monitor. Will get notes from recent GI visit.

## 2011-07-29 ENCOUNTER — Telehealth: Payer: Self-pay | Admitting: Internal Medicine

## 2011-07-29 NOTE — Telephone Encounter (Signed)
Patient trying everything . Unable to have a BM for 5 days . Patient is hurting ,Stomach tight and afraid to eat. Mira lax was taken last night. Phillips Milk of Magnesia was taken and did not work. Would like and appointment or told what she needs to do.

## 2011-07-29 NOTE — Telephone Encounter (Signed)
RC to patient.  She has a friend who is a Engineer, civil (consulting) at her house now who is going to help her.  She has not had a BM since calling us.  Pt was not given any advice as she said she had the nurse there.

## 2011-07-29 NOTE — Telephone Encounter (Signed)
We can make an appointment for her, but in the interim, she might want to try a Fleets enema.

## 2011-07-30 ENCOUNTER — Emergency Department: Payer: Self-pay | Admitting: Emergency Medicine

## 2011-07-30 ENCOUNTER — Telehealth: Payer: Self-pay | Admitting: Internal Medicine

## 2011-07-30 NOTE — Telephone Encounter (Signed)
Caller: Hannah Santos/Patient; PCP: Ronna Polio; CB#: [phone]; ; ; Call regarding Constipated for 2. Days;  Pt is calling - she tells RN she had diarrhea for a year and was positive for c-diff. Pt was put on budesonide for the past 3 months. Pt states that in order to stimulate a bm she had to take something and now she can't pass a bm at all. last night the pt was up very uncomfortable all night just passing liquid. Pt is straining to have a bm x the past 5 days. Rn triaged and advised an appt. Pt had called yesterday and had nursing  had 2 dulcolax supp/ 1 fleets enema and 1 glass of "something that tastes bad". Pt has passed nothing but water. Appts are full today/Rn called the back line and spoke with Stephanie/Dr. Sonny Dandy nurse who , after speaking with MD advised UC or ED today.

## 2011-10-13 ENCOUNTER — Ambulatory Visit (INDEPENDENT_AMBULATORY_CARE_PROVIDER_SITE_OTHER): Payer: Medicare Other | Admitting: Internal Medicine

## 2011-10-13 ENCOUNTER — Encounter: Payer: Self-pay | Admitting: Internal Medicine

## 2011-10-13 VITALS — BP 140/70 | HR 67 | Temp 98.3°F | Ht 66.0 in | Wt 150.0 lb

## 2011-10-13 DIAGNOSIS — M1990 Unspecified osteoarthritis, unspecified site: Secondary | ICD-10-CM

## 2011-10-13 DIAGNOSIS — K5190 Ulcerative colitis, unspecified, without complications: Secondary | ICD-10-CM

## 2011-10-13 DIAGNOSIS — D510 Vitamin B12 deficiency anemia due to intrinsic factor deficiency: Secondary | ICD-10-CM

## 2011-10-13 DIAGNOSIS — R413 Other amnesia: Secondary | ICD-10-CM

## 2011-10-13 LAB — TSH: TSH: 1.22 u[IU]/mL (ref 0.35–5.50)

## 2011-10-13 LAB — COMPREHENSIVE METABOLIC PANEL
Albumin: 3.9 g/dL (ref 3.5–5.2)
BUN: 10 mg/dL (ref 6–23)
CO2: 28 mEq/L (ref 19–32)
Calcium: 9.3 mg/dL (ref 8.4–10.5)
Chloride: 104 mEq/L (ref 96–112)
GFR: 69.56 mL/min (ref >60.00)
Glucose, Bld: 79 mg/dL (ref 70–99)
Potassium: 4.1 mEq/L (ref 3.5–5.1)
Sodium: 140 mEq/L (ref 135–145)
Total Protein: 7 g/dL (ref 6.0–8.3)

## 2011-10-13 LAB — CBC WITH DIFFERENTIAL/PLATELET
Basophils Relative: 0.1 % (ref 0.0–3.0)
Eosinophils Relative: 0.3 % (ref 0.0–5.0)
Lymphocytes Relative: 24.8 % (ref 12.0–46.0)
MCV: 96.9 fl (ref 78.0–100.0)
Monocytes Absolute: 0.5 10*3/uL (ref 0.1–1.0)
Monocytes Relative: 7.6 % (ref 3.0–12.0)
Neutrophils Relative %: 67.2 % (ref 43.0–77.0)
RBC: 3.98 Mil/uL (ref 3.87–5.11)
WBC: 6.3 10*3/uL (ref 4.5–10.5)

## 2011-10-13 NOTE — Assessment & Plan Note (Signed)
Chronic. Symptoms severe in left knee. Will set up orthopedic evaluation to see if patient would be candidate for left knee replacement.

## 2011-10-13 NOTE — Assessment & Plan Note (Signed)
Patient is concerned about gradual memory loss. Will set up cognitive testing for further evaluation. Will also check TSH and B12 with labs. Followup 3 months.

## 2011-10-13 NOTE — Assessment & Plan Note (Signed)
Recent exacerbation of constipation symptoms secondary to use of budesonide. Budesonide has been decreased to every other day. We'll continue to monitor.

## 2011-10-13 NOTE — Progress Notes (Signed)
Subjective:    Patient ID: Hannah Santos, female    DOB: [birth year], 78 y.o.   MRN: [id number]  HPI  78 year old female with history of depression, hypothyroidism, osteoarthritis presents for followup. She has 2 concerns today. First, she notes gradual worsening and memory loss. She reports she is able to manage her own finances and has not had problems with everyday activities, however she has some difficulty remembering names of friends. She would like to pursue cognitive testing to assess the degree of memory impairment.  In regards to her history of osteoarthritis, she reports symptoms are now severe in her left knee. She has difficulty with ambulation and is concerned about risk of falling. She would like to set up evaluation with orthopedics for evaluation for knee replacement.  In regards to her history of ulcerative colitis, she reports she had bowel obstruction secondary to use of budesonide. She is now weaning budesonide every other day with improvement in her symptoms.  Outpatient Encounter Prescriptions as of 10/13/2011  Medication Sig Dispense Refill  . acetaminophen (TYLENOL) 500 MG tablet Take 500 mg by mouth every 6 (six) hours as needed. 2 tablets       . B Complex-C (B-COMPLEX WITH VITAMIN C) tablet Take 1 tablet by mouth daily.        . budesonide (ENTOCORT EC) 3 MG 24 hr capsule Take 3 mg by mouth every other day.       . Cholecalciferol (VITAMIN D3) 1000 UNITS CAPS Take 1 tablet by mouth daily.        . cyanocobalamin (,VITAMIN B-12,) 1000 MCG/ML injection Inject 1 mL (1,000 mcg total) into the skin every 30 (thirty) days.  10 mL  1  . DUREZOL 0.05 % EMUL Place 0.5 drops into the left eye daily.       Marland Kitchen EVISTA 60 MG tablet Take 60 mg by mouth daily.       Marland Kitchen GLUCOSAMINE-CHONDROITIN PO Take 1 tablet by mouth daily.        Marland Kitchen loratadine (CLARITIN) 10 MG tablet Take 10 mg by mouth daily as needed.        Marland Kitchen LORazepam (ATIVAN) 0.5 MG tablet 3 (three) times daily as needed.         . Omega-3 Fatty Acids (FISH OIL) 1200 MG CAPS Take 1 capsule by mouth daily.        . Simethicone (GAS RELIEF PO) Take 2 tablets by mouth as needed.        . SYRINGE-NEEDLE, DISP, 3 ML (B-D INTEGRA SYRINGE) 25G X 5/8" 3 ML MISC Use with b12  50 each  0  . venlafaxine (EFFEXOR XR) 37.5 MG 24 hr capsule Take 1 capsule (37.5 mg total) by mouth daily.  30 capsule  6  . venlafaxine (EFFEXOR-XR) 150 MG 24 hr capsule Take 1 capsule (150 mg total) by mouth daily.  30 capsule  6    Review of Systems  Constitutional: Negative for fever, chills, appetite change, fatigue and unexpected weight change.  HENT: Negative for ear pain, congestion, sore throat, trouble swallowing, neck pain, voice change and sinus pressure.   Eyes: Negative for visual disturbance.  Respiratory: Negative for cough, shortness of breath, wheezing and stridor.   Cardiovascular: Negative for chest pain, palpitations and leg swelling.  Gastrointestinal: Positive for constipation. Negative for nausea, abdominal pain, diarrhea and abdominal distention.  Genitourinary: Negative for dysuria and flank pain.  Musculoskeletal: Positive for myalgias, joint swelling and arthralgias. Negative for gait problem.  Skin:  Negative for color change and rash.  Neurological: Negative for dizziness and headaches.  Hematological: Negative for adenopathy. Does not bruise/bleed easily.  Psychiatric/Behavioral: Positive for decreased concentration. Negative for suicidal ideas, disturbed wake/sleep cycle and dysphoric mood. The patient is not nervous/anxious.    BP 140/70  Pulse 67  Temp 98.3 F (36.8 C) (Oral)  Ht 5\' 6"  (1.676 m)  Wt 150 lb (68.04 kg)  BMI 24.21 kg/m2  SpO2 97%     Objective:   Physical Exam  Constitutional: She is oriented to person, place, and time. She appears well-developed and well-nourished. No distress.  HENT:  Head: Normocephalic and atraumatic.  Right Ear: External ear normal.  Left Ear: External ear normal.  Nose:  Nose normal.  Mouth/Throat: Oropharynx is clear and moist. No oropharyngeal exudate.  Eyes: Conjunctivae are normal. Pupils are equal, round, and reactive to light. Right eye exhibits no discharge. Left eye exhibits no discharge. No scleral icterus.  Neck: Normal range of motion. Neck supple. No tracheal deviation present. No thyromegaly present.  Cardiovascular: Normal rate, regular rhythm, normal heart sounds and intact distal pulses.  Exam reveals no gallop and no friction rub.   No murmur heard. Pulmonary/Chest: Effort normal and breath sounds normal. No respiratory distress. She has no wheezes. She has no rales. She exhibits no tenderness.  Abdominal: Soft. Bowel sounds are normal. She exhibits no distension and no mass. There is no tenderness. There is no guarding.  Musculoskeletal: She exhibits no edema and no tenderness.       Left knee: She exhibits decreased range of motion.  Lymphadenopathy:    She has no cervical adenopathy.  Neurological: She is alert and oriented to person, place, and time. No cranial nerve deficit. She exhibits normal muscle tone. Coordination normal.  Skin: Skin is warm and dry. No rash noted. She is not diaphoretic. No erythema. No pallor.  Psychiatric: She has a normal mood and affect. Her speech is normal and behavior is normal. Judgment and thought content normal. Cognition and memory are normal.          Assessment & Plan:

## 2011-11-08 IMAGING — CT CT ABD-PELV W/ CM
1 of 2 series · 15 of 32 positions shown, 19 images · IV contrast (isovue)
Comparison: 12/01/2006

REASON FOR EXAM: (1) abd pain; (2) pel pain
COMMENTS:

PROCEDURE:     CT  - CT ABDOMEN / PELVIS  W  - December 21, 2009  [DATE]
RESULT:     History: Abdominal pain
TECHNIQUE: Multiple axial images of the abdomen and pelvis were performed
from the lung bases to the pubic symphysis, with p.o. contrast and with 100
ml of Isovue 370 intravenous contrast.

[Series 2: 3mm soft tissue · axial · 0.72mm/px · z∈[-548,-124]mm · 15 of 155 slices shown, 19 images]
[im 7/155  soft-tissue]
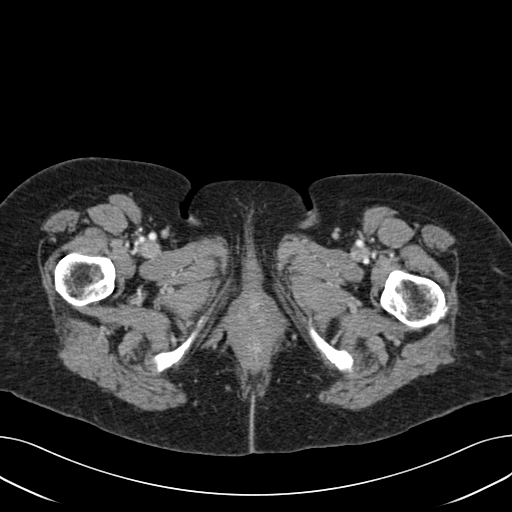
[im 7/155  bone]
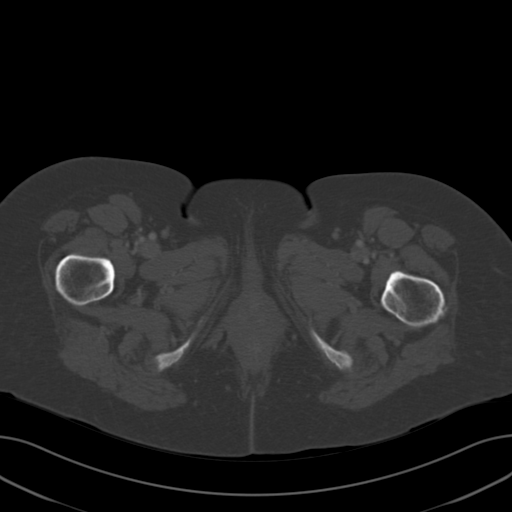
[im 20/155  soft-tissue]
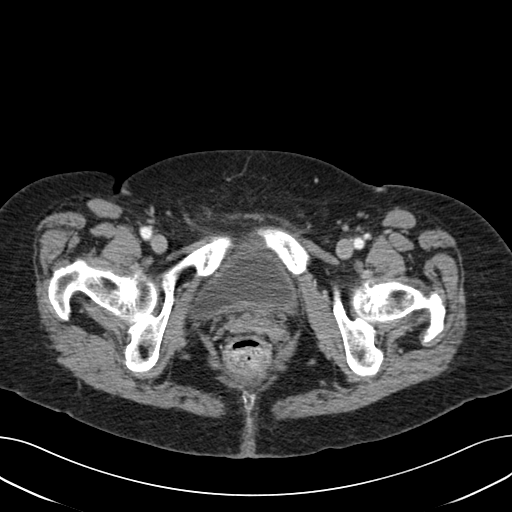
[im 33/155  soft-tissue]
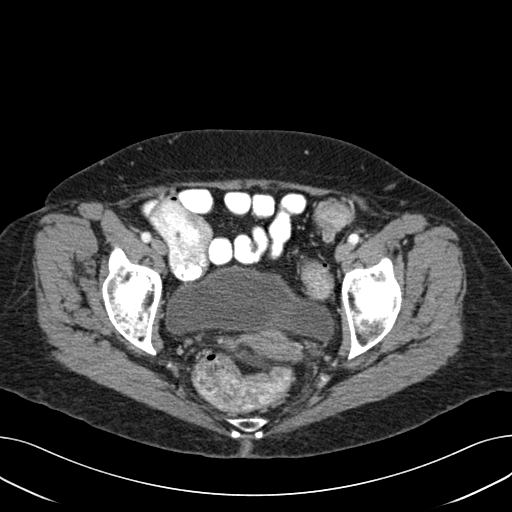
[im 45/155  soft-tissue]
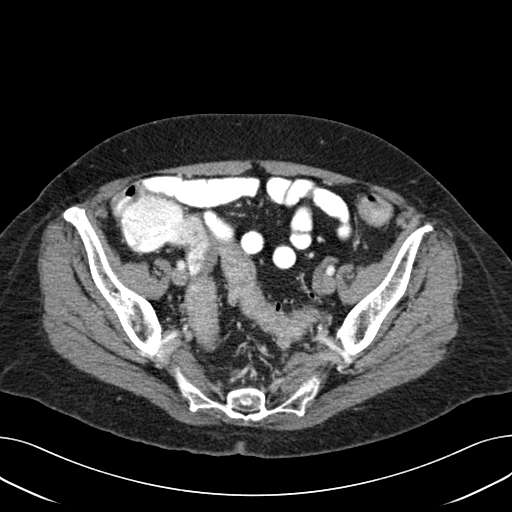
[im 52/155  soft-tissue]
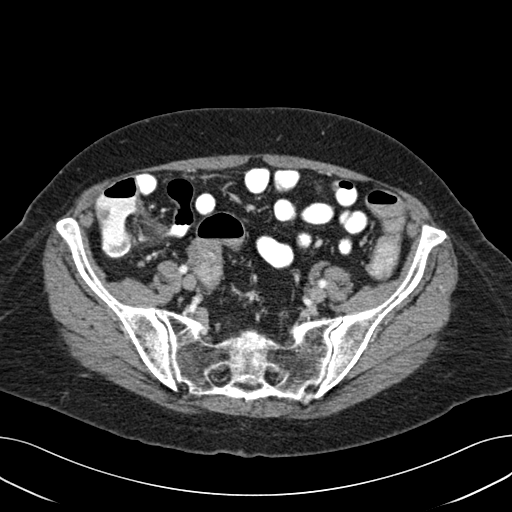
[im 65/155  soft-tissue]
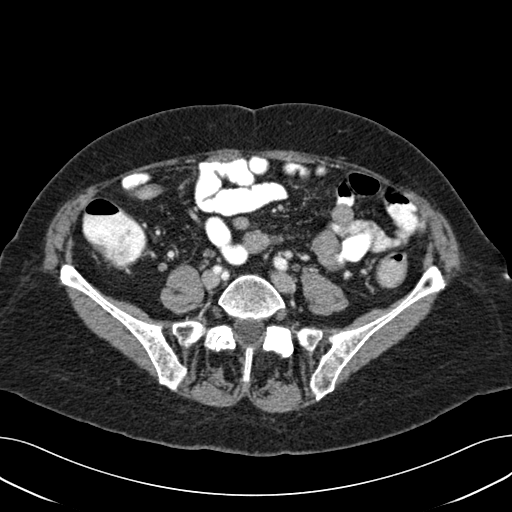
[im 78/155  soft-tissue]
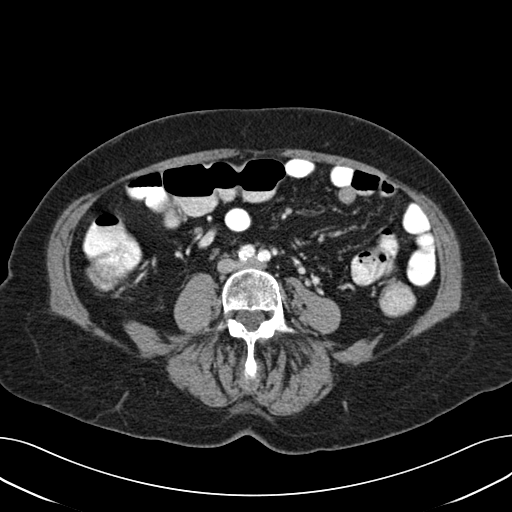
[im 90/155  soft-tissue]
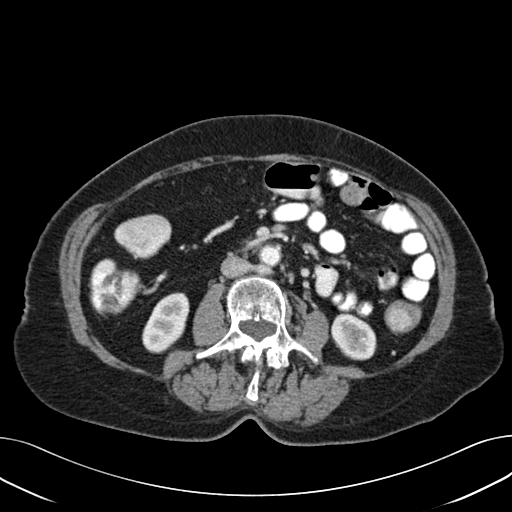
[im 103/155  soft-tissue]
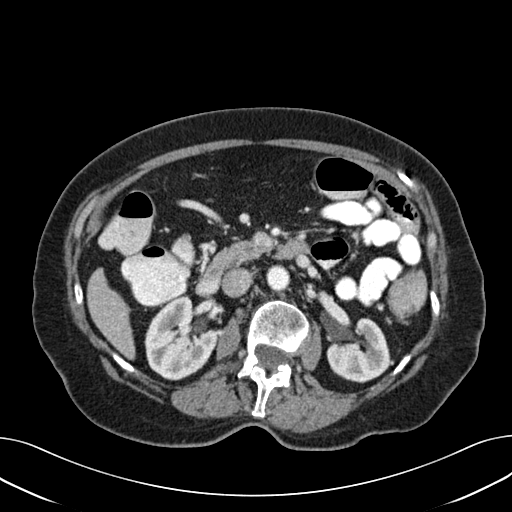
[im 103/155  bone]
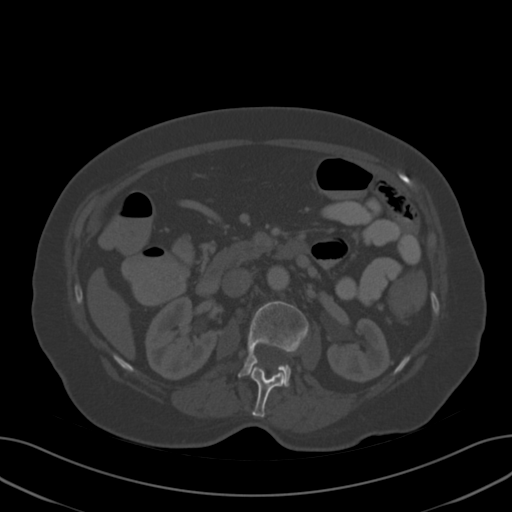
[im 110/155  soft-tissue]
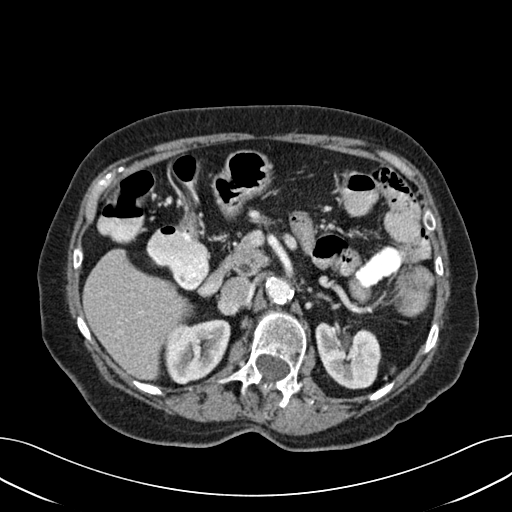
[im 122/155  soft-tissue]
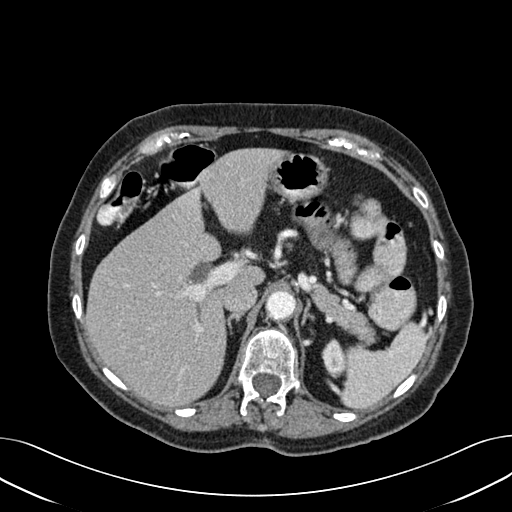
[im 129/155  lung]
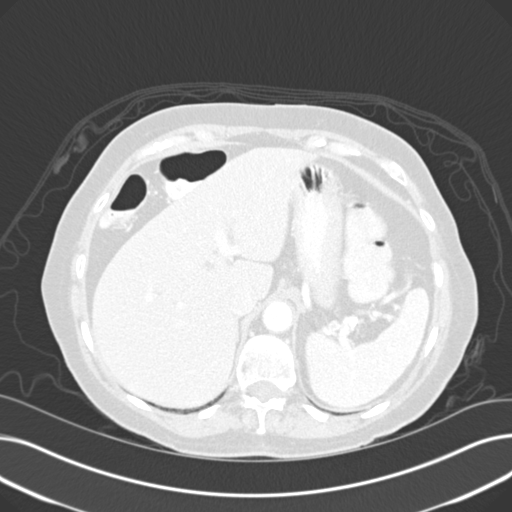
[im 135/155  soft-tissue]
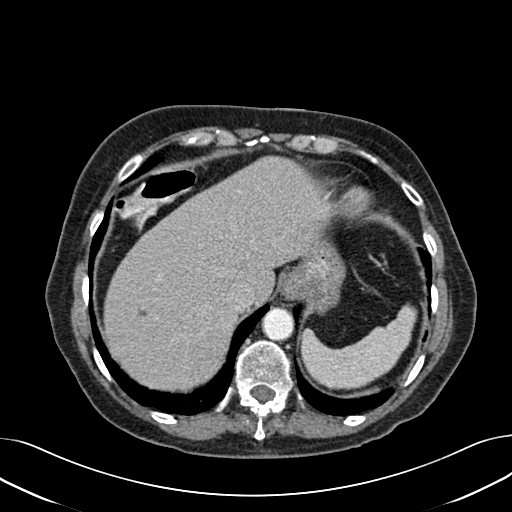
[im 135/155  lung]
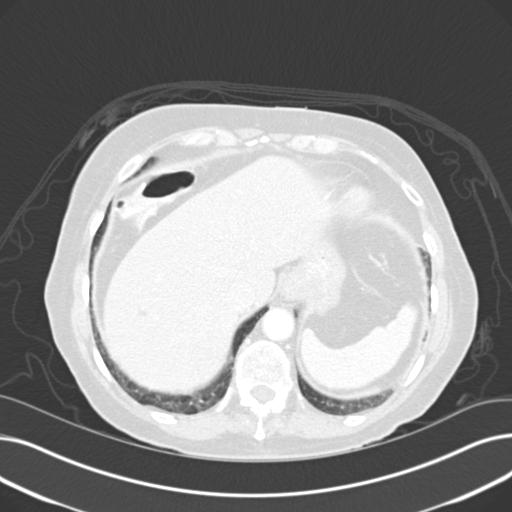
[im 142/155  lung]
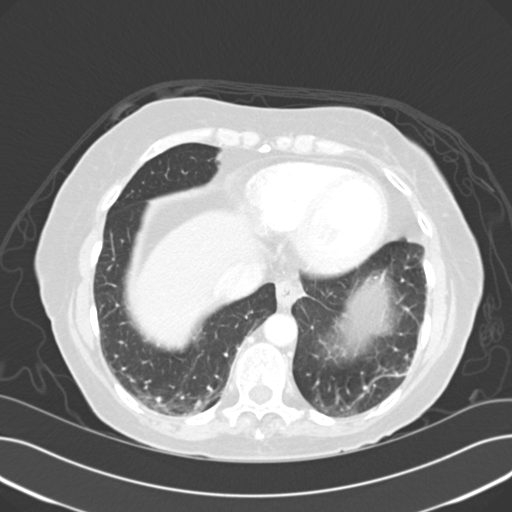
[im 148/155  soft-tissue]
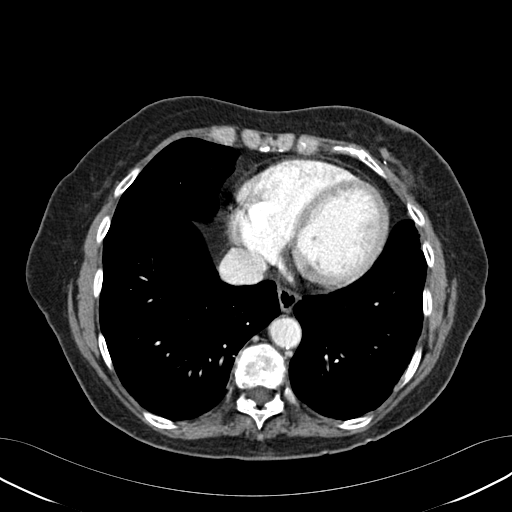
[im 148/155  lung]
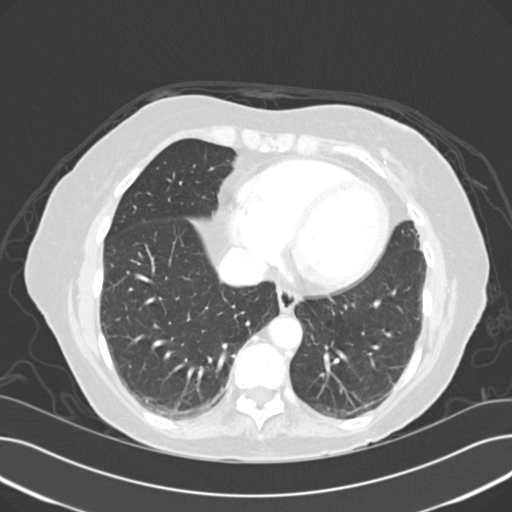

[15 of 32 positions shown; findings below may reference images not displayed]

FINDINGS: The lung bases are clear. There is no pneumothorax. The heart size is
normal.

The liver demonstrates no focal abnormality. There is mild dilatation of the
proximal common bile duct which tapers to a normal caliber at the level of
the pancreas likely representing post cholecystectomy changes. The
gallbladder is surgically absent a. The spleen demonstrates no focal
abnormality. The kidneys, adrenal glands, and pancreas are normal. The
bladder is unremarkable.

The stomach, duodenum, small intestine, and large intestine demonstrate no
contrast extravasation or dilatation.  There is no pneumoperitoneum,
pneumatosis, or portal venous gas. There is no abdominal or pelvic free
fluid. There is no lymphadenopathy.

The abdominal aorta is normal in caliber with atherosclerosis.

There is a levocurvature of the lumbar spine.
IMPRESSION: No acute abdominal or pelvic pathology.

## 2012-01-13 ENCOUNTER — Ambulatory Visit (INDEPENDENT_AMBULATORY_CARE_PROVIDER_SITE_OTHER): Payer: Medicare Other | Admitting: Internal Medicine

## 2012-01-13 ENCOUNTER — Encounter: Payer: Self-pay | Admitting: Internal Medicine

## 2012-01-13 VITALS — BP 110/60 | HR 73 | Temp 98.1°F | Ht 66.0 in | Wt 147.5 lb

## 2012-01-13 DIAGNOSIS — K5190 Ulcerative colitis, unspecified, without complications: Secondary | ICD-10-CM

## 2012-01-13 DIAGNOSIS — R413 Other amnesia: Secondary | ICD-10-CM

## 2012-01-13 DIAGNOSIS — F329 Major depressive disorder, single episode, unspecified: Secondary | ICD-10-CM

## 2012-01-13 MED ORDER — VENLAFAXINE HCL ER 150 MG PO CP24: 150.0000 mg | ORAL_CAPSULE | Freq: Every day | ORAL | Status: DC

## 2012-01-13 MED ORDER — VENLAFAXINE HCL ER 75 MG PO CP24: 75.0000 mg | ORAL_CAPSULE | Freq: Every day | ORAL | Status: DC

## 2012-01-13 NOTE — Progress Notes (Signed)
Subjective:    Patient ID: Hannah Santos, female    DOB: September 28, 1930, 78 y.o.   MRN: [id number]  HPI 78 year old female with history of ulcerative colitis, depression, memory loss, pernicious anemia presents for followup. She notes that depression and anxiety have recently been worsened as she is helping daughter to provide care for her son-in-law has dementia. She reports that as her anxiety level has increased her memory loss seems to have worsened. She did meet with a psychologist for cognitive testing but did not find ongoing treatment helpful. She is interested in increasing the dose of her Effexor to see if any improvement in her symptoms.  In regards to ulcerative colitis, she reports that symptoms are well controlled budesonide 3 times daily.  Outpatient Encounter Prescriptions as of 01/13/2012  Medication Sig Dispense Refill  . acetaminophen (TYLENOL) 500 MG tablet Take 500 mg by mouth every 6 (six) hours as needed. 2 tablets       . B Complex-C (B-COMPLEX WITH VITAMIN C) tablet Take 1 tablet by mouth daily.        . budesonide (ENTOCORT EC) 3 MG 24 hr capsule Take 3 mg by mouth every other day.       . Cholecalciferol (VITAMIN D3) 1000 UNITS CAPS Take 1 tablet by mouth daily.        . cyanocobalamin (,VITAMIN B-12,) 1000 MCG/ML injection Inject 1 mL (1,000 mcg total) into the skin every 30 (thirty) days.  10 mL  1  . DUREZOL 0.05 % EMUL Place 0.5 drops into the left eye daily.       Marland Kitchen EVISTA 60 MG tablet Take 60 mg by mouth daily.       Marland Kitchen GLUCOSAMINE-CHONDROITIN PO Take 1 tablet by mouth daily.        Marland Kitchen loratadine (CLARITIN) 10 MG tablet Take 10 mg by mouth daily as needed.        Marland Kitchen LORazepam (ATIVAN) 0.5 MG tablet 3 (three) times daily as needed.       . Omega-3 Fatty Acids (FISH OIL) 1200 MG CAPS Take 1 capsule by mouth daily.        . Simethicone (GAS RELIEF PO) Take 2 tablets by mouth as needed.        . SYRINGE-NEEDLE, DISP, 3 ML (B-D INTEGRA SYRINGE) 25G X 5/8" 3 ML MISC Use  with b12  50 each  0  . DISCONTD: venlafaxine (EFFEXOR XR) 37.5 MG 24 hr capsule Take 1 capsule (37.5 mg total) by mouth daily.  30 capsule  6  . DISCONTD: venlafaxine (EFFEXOR-XR) 150 MG 24 hr capsule Take 1 capsule (150 mg total) by mouth daily.  30 capsule  6  . venlafaxine XR (EFFEXOR-XR) 150 MG 24 hr capsule Take 1 capsule (150 mg total) by mouth daily.  30 capsule  6  . venlafaxine XR (EFFEXOR-XR) 75 MG 24 hr capsule Take 1 capsule (75 mg total) by mouth daily.  30 capsule  6   BP 110/60  Pulse 73  Temp 98.1 F (36.7 C) (Oral)  Ht 5\' 6"  (1.676 m)  Wt 147 lb 8 oz (66.906 kg)  BMI 23.81 kg/m2  SpO2 98%  Review of Systems  Constitutional: Negative for fever, chills, appetite change, fatigue and unexpected weight change.  HENT: Negative for ear pain, congestion, sore throat, trouble swallowing, neck pain, voice change and sinus pressure.   Eyes: Negative for visual disturbance.  Respiratory: Negative for cough, shortness of breath, wheezing and stridor.   Cardiovascular: Negative  for chest pain, palpitations and leg swelling.  Gastrointestinal: Negative for nausea, vomiting, abdominal pain, diarrhea, constipation, blood in stool, abdominal distention and anal bleeding.  Genitourinary: Negative for dysuria and flank pain.  Musculoskeletal: Negative for myalgias, arthralgias and gait problem.  Skin: Negative for color change and rash.  Neurological: Negative for dizziness and headaches.  Hematological: Negative for adenopathy. Does not bruise/bleed easily.  Psychiatric/Behavioral: Positive for confusion, dysphoric mood and decreased concentration. Negative for suicidal ideas and disturbed wake/sleep cycle. The patient is nervous/anxious.        Objective:   Physical Exam  Constitutional: She is oriented to person, place, and time. She appears well-developed and well-nourished. No distress.  HENT:  Head: Normocephalic and atraumatic.  Right Ear: External ear normal.  Left Ear:  External ear normal.  Nose: Nose normal.  Mouth/Throat: Oropharynx is clear and moist. No oropharyngeal exudate.  Eyes: Conjunctivae normal are normal. Pupils are equal, round, and reactive to light. Right eye exhibits no discharge. Left eye exhibits no discharge. No scleral icterus.  Neck: Normal range of motion. Neck supple. No tracheal deviation present. No thyromegaly present.  Cardiovascular: Normal rate, regular rhythm, normal heart sounds and intact distal pulses.  Exam reveals no gallop and no friction rub.   No murmur heard. Pulmonary/Chest: Effort normal and breath sounds normal. No respiratory distress. She has no wheezes. She has no rales. She exhibits no tenderness.  Musculoskeletal: Normal range of motion. She exhibits no edema and no tenderness.  Lymphadenopathy:    She has no cervical adenopathy.  Neurological: She is alert and oriented to person, place, and time. No cranial nerve deficit. She exhibits normal muscle tone. Coordination normal.  Skin: Skin is warm and dry. No rash noted. She is not diaphoretic. No erythema. No pallor.  Psychiatric: She has a normal mood and affect. Her behavior is normal. Judgment and thought content normal.          Assessment & Plan:

## 2012-01-13 NOTE — Assessment & Plan Note (Signed)
Patient complete a cognitive testing evaluation. Will request records on this. Currently memory loss is worsened by increased anxiety and depression. Increasing Effexor as noted.

## 2012-01-13 NOTE — Assessment & Plan Note (Signed)
Symptoms of depression recently worsened with stressors related to caring for her daughter. Offered support today. Will increase Effexor to 225 mg daily. Followup in one month or sooner as needed. Over 25 minutes face-to-face time spent with patient discussing ongoing issues and management.

## 2012-01-13 NOTE — Assessment & Plan Note (Signed)
Symptomatically well-controlled using budesonide 3 times weekly. Will continue to monitor.

## 2012-01-20 ENCOUNTER — Other Ambulatory Visit: Payer: Self-pay

## 2012-01-21 ENCOUNTER — Other Ambulatory Visit: Payer: Self-pay | Admitting: *Deleted

## 2012-01-21 MED ORDER — VENLAFAXINE HCL ER 37.5 MG PO CP24: ORAL_CAPSULE | ORAL | Status: DC

## 2012-01-26 ENCOUNTER — Telehealth: Payer: Self-pay | Admitting: Internal Medicine

## 2012-01-26 DIAGNOSIS — F32A Depression, unspecified: Secondary | ICD-10-CM

## 2012-01-26 DIAGNOSIS — F329 Major depressive disorder, single episode, unspecified: Secondary | ICD-10-CM

## 2012-01-26 NOTE — Telephone Encounter (Signed)
Fine to fill. 

## 2012-01-26 NOTE — Telephone Encounter (Signed)
Refill request for venlafaxine HCL ER 150 MG cap  30 QTY SIG: take 1 capsule (150 mg total) by mouth daily

## 2012-01-27 MED ORDER — VENLAFAXINE HCL ER 150 MG PO CP24: 150.0000 mg | ORAL_CAPSULE | Freq: Every day | ORAL | Status: DC

## 2012-01-27 NOTE — Telephone Encounter (Signed)
rx was sent electronically on 01/26/12

## 2012-02-10 ENCOUNTER — Ambulatory Visit (INDEPENDENT_AMBULATORY_CARE_PROVIDER_SITE_OTHER): Payer: Medicare Other | Admitting: Internal Medicine

## 2012-02-10 ENCOUNTER — Encounter: Payer: Self-pay | Admitting: Internal Medicine

## 2012-02-10 VITALS — BP 120/64 | HR 84 | Temp 98.4°F | Ht 66.0 in | Wt 151.0 lb

## 2012-02-10 DIAGNOSIS — F329 Major depressive disorder, single episode, unspecified: Secondary | ICD-10-CM

## 2012-02-10 DIAGNOSIS — M1990 Unspecified osteoarthritis, unspecified site: Secondary | ICD-10-CM

## 2012-02-10 DIAGNOSIS — R413 Other amnesia: Secondary | ICD-10-CM

## 2012-02-10 MED ORDER — VENLAFAXINE HCL ER 75 MG PO CP24: 75.0000 mg | ORAL_CAPSULE | Freq: Every day | ORAL | Status: DC

## 2012-02-10 NOTE — Assessment & Plan Note (Signed)
Symptoms of depression are persistent however patient has not yet increased dose of medication. Will increase dose of Effexor to 225 mg daily. Followup in one month.

## 2012-02-10 NOTE — Assessment & Plan Note (Signed)
Symptoms improved after repeat steroid injections. We'll continue to monitor.

## 2012-02-10 NOTE — Progress Notes (Signed)
Subjective:    Patient ID: Hannah Santos, female    DOB: [birth year], 78 y.o.   MRN: [id number]  HPI 78 year old female with history of depression, memory loss, osteoarthritis, and ulcerative colitis presents for followup. At her last visit, she complained of worsening symptoms of depression with depressed mood. Her dose of Effexor was increased to 225 mg daily. However, there is some confusion about this at the pharmacy and she never received increase dose of medication. She reports that symptoms have been persistent. She reports persistent depressed mood and frequent tearfulness. She also notes gradual decline in her memory. For example, she was unable to remember her combination for her mailbox, the same mailbox she has used for over 20 years. She is interested in potentially starting medication to help with memory loss.  In regards to osteoarthritis, she reports that symptoms of left knee pain are significantly improved with steroid injections. She is scheduled for another steroid injection this month.  Outpatient Encounter Prescriptions as of 02/10/2012  Medication Sig Dispense Refill  . acetaminophen (TYLENOL) 500 MG tablet Take 500 mg by mouth every 6 (six) hours as needed. 2 tablets       . B Complex-C (B-COMPLEX WITH VITAMIN C) tablet Take 1 tablet by mouth daily.        . budesonide (ENTOCORT EC) 3 MG 24 hr capsule Take 3 mg by mouth every other day.       . Cholecalciferol (VITAMIN D3) 1000 UNITS CAPS Take 1 tablet by mouth daily.        . cyanocobalamin (,VITAMIN B-12,) 1000 MCG/ML injection Inject 1 mL (1,000 mcg total) into the skin every 30 (thirty) days.  10 mL  1  . DUREZOL 0.05 % EMUL Place 0.5 drops into the left eye daily.       Marland Kitchen EVISTA 60 MG tablet Take 60 mg by mouth daily.       Marland Kitchen GLUCOSAMINE-CHONDROITIN PO Take 1 tablet by mouth daily.        Marland Kitchen loratadine (CLARITIN) 10 MG tablet Take 10 mg by mouth daily as needed.        Marland Kitchen LORazepam (ATIVAN) 0.5 MG tablet 3 (three)  times daily as needed.       . Omega-3 Fatty Acids (FISH OIL) 1200 MG CAPS Take 1 capsule by mouth daily.        . Simethicone (GAS RELIEF PO) Take 2 tablets by mouth as needed.        . SYRINGE-NEEDLE, DISP, 3 ML (B-D INTEGRA SYRINGE) 25G X 5/8" 3 ML MISC Use with b12  50 each  0  . venlafaxine XR (EFFEXOR-XR) 150 MG 24 hr capsule Take 1 capsule (150 mg total) by mouth daily.  30 capsule  6  . [DISCONTINUED] venlafaxine XR (EFFEXOR-XR) 37.5 MG 24 hr capsule Take one capsule by mouth daily along with 150mg  capsule  30 capsule  6  . venlafaxine XR (EFFEXOR-XR) 75 MG 24 hr capsule Take 1 capsule (75 mg total) by mouth daily.  30 capsule  6  . [DISCONTINUED] venlafaxine XR (EFFEXOR-XR) 75 MG 24 hr capsule Take 1 capsule (75 mg total) by mouth daily.  30 capsule  6   BP 120/64  Pulse 84  Temp 98.4 F (36.9 C) (Oral)  Ht 5\' 6"  (1.676 m)  Wt 151 lb (68.493 kg)  BMI 24.37 kg/m2  SpO2 98%  Review of Systems  Constitutional: Negative for fever, chills, appetite change, fatigue and unexpected weight change.  HENT: Negative  for ear pain, congestion, sore throat, trouble swallowing, neck pain, voice change and sinus pressure.   Eyes: Negative for visual disturbance.  Respiratory: Negative for cough, shortness of breath, wheezing and stridor.   Cardiovascular: Negative for chest pain, palpitations and leg swelling.  Gastrointestinal: Negative for nausea, vomiting, abdominal pain, diarrhea, constipation, blood in stool, abdominal distention and anal bleeding.  Genitourinary: Negative for dysuria and flank pain.  Musculoskeletal: Negative for myalgias, arthralgias and gait problem.  Skin: Negative for color change and rash.  Neurological: Negative for dizziness and headaches.  Hematological: Negative for adenopathy. Does not bruise/bleed easily.  Psychiatric/Behavioral: Positive for confusion, dysphoric mood and decreased concentration. Negative for suicidal ideas and sleep disturbance. The patient is  nervous/anxious.        Objective:   Physical Exam  Constitutional: She is oriented to person, place, and time. She appears well-developed and well-nourished. No distress.  HENT:  Head: Normocephalic and atraumatic.  Right Ear: External ear normal.  Left Ear: External ear normal.  Nose: Nose normal.  Mouth/Throat: Oropharynx is clear and moist. No oropharyngeal exudate.  Eyes: Conjunctivae normal are normal. Pupils are equal, round, and reactive to light. Right eye exhibits no discharge. Left eye exhibits no discharge. No scleral icterus.  Neck: Normal range of motion. Neck supple. No tracheal deviation present. No thyromegaly present.  Cardiovascular: Normal rate, regular rhythm, normal heart sounds and intact distal pulses.  Exam reveals no gallop and no friction rub.   No murmur heard. Pulmonary/Chest: Effort normal and breath sounds normal. No respiratory distress. She has no wheezes. She has no rales. She exhibits no tenderness.  Musculoskeletal: Normal range of motion. She exhibits no edema and no tenderness.  Lymphadenopathy:    She has no cervical adenopathy.  Neurological: She is alert and oriented to person, place, and time. No cranial nerve deficit. She exhibits normal muscle tone. Coordination normal.  Skin: Skin is warm and dry. No rash noted. She is not diaphoretic. No erythema. No pallor.  Psychiatric: Her behavior is normal. Judgment and thought content normal. She exhibits a depressed mood.          Assessment & Plan:

## 2012-02-10 NOTE — Assessment & Plan Note (Signed)
Symptoms of memory loss progressive. We discussed starting Aricept and/or Namenda today. However, given ongoing issues with depression, we'll first try increasing dose of Effexor. Followup in one month.

## 2012-03-11 ENCOUNTER — Ambulatory Visit (INDEPENDENT_AMBULATORY_CARE_PROVIDER_SITE_OTHER): Payer: Medicare Other | Admitting: Internal Medicine

## 2012-03-11 ENCOUNTER — Encounter: Payer: Self-pay | Admitting: Internal Medicine

## 2012-03-11 VITALS — BP 120/68 | HR 68 | Temp 97.9°F | Resp 16 | Wt 148.8 lb

## 2012-03-11 DIAGNOSIS — Z299 Encounter for prophylactic measures, unspecified: Secondary | ICD-10-CM

## 2012-03-11 DIAGNOSIS — Z0000 Encounter for general adult medical examination without abnormal findings: Secondary | ICD-10-CM

## 2012-03-11 DIAGNOSIS — B953 Streptococcus pneumoniae as the cause of diseases classified elsewhere: Secondary | ICD-10-CM

## 2012-03-11 DIAGNOSIS — R413 Other amnesia: Secondary | ICD-10-CM

## 2012-03-11 DIAGNOSIS — Z23 Encounter for immunization: Secondary | ICD-10-CM

## 2012-03-11 DIAGNOSIS — A491 Streptococcal infection, unspecified site: Secondary | ICD-10-CM

## 2012-03-11 DIAGNOSIS — F329 Major depressive disorder, single episode, unspecified: Secondary | ICD-10-CM

## 2012-03-11 MED ORDER — PNEUMOCOCCAL VAC POLYVALENT 25 MCG/0.5ML IJ INJ
0.5000 mL | INJECTION | Freq: Once | INTRAMUSCULAR | Status: AC
  Administered 2012-03-11: 0.5 mL via INTRAMUSCULAR

## 2012-03-11 MED ORDER — DONEPEZIL HCL 5 MG PO TABS: 5.0000 mg | ORAL_TABLET | Freq: Every evening | ORAL | Status: AC | PRN

## 2012-03-13 NOTE — Assessment & Plan Note (Signed)
Symptoms of depression are improved with increased dose of Effexor. Will continue. Follow up 3 months or sooner as needed.

## 2012-03-13 NOTE — Progress Notes (Signed)
Subjective:    Patient ID: Hannah Santos, female    DOB: October 25, 1930, 78 y.o.   MRN: [id number]  HPI 78 year old female with history of ulcerative colitis, depression, and mild dementia presents for followup. At her last visit, we increased her dose of Effexor to help better control symptoms of depression. She reports some improvement with increased dose of medication. She reports some continued issues with her memory. She has difficulty remembering names and finding her words at times. She has occasionally had difficulty with directions. She is interested in starting medication to help slow progression of memory loss.  Outpatient Encounter Prescriptions as of 03/11/2012  Medication Sig Dispense Refill  . acetaminophen (TYLENOL) 500 MG tablet Take 500 mg by mouth every 6 (six) hours as needed. 2 tablets       . B Complex-C (B-COMPLEX WITH VITAMIN C) tablet Take 1 tablet by mouth daily.        . budesonide (ENTOCORT EC) 3 MG 24 hr capsule Take 3 mg by mouth every other day.       . Cholecalciferol (VITAMIN D3) 1000 UNITS CAPS Take 1 tablet by mouth daily.        . cyanocobalamin (,VITAMIN B-12,) 1000 MCG/ML injection Inject 1 mL (1,000 mcg total) into the skin every 30 (thirty) days.  10 mL  1  . DUREZOL 0.05 % EMUL Place 0.5 drops into the left eye daily.       Marland Kitchen GLUCOSAMINE-CHONDROITIN PO Take 1 tablet by mouth daily.        Marland Kitchen loratadine (CLARITIN) 10 MG tablet Take 10 mg by mouth daily as needed.        Marland Kitchen LORazepam (ATIVAN) 0.5 MG tablet 3 (three) times daily as needed.       . Omega-3 Fatty Acids (FISH OIL) 1200 MG CAPS Take 1 capsule by mouth daily.        . Simethicone (GAS RELIEF PO) Take 2 tablets by mouth as needed.        . SYRINGE-NEEDLE, DISP, 3 ML (B-D INTEGRA SYRINGE) 25G X 5/8" 3 ML MISC Use with b12  50 each  0  . venlafaxine XR (EFFEXOR-XR) 150 MG 24 hr capsule Take 1 capsule (150 mg total) by mouth daily.  30 capsule  6  . venlafaxine XR (EFFEXOR-XR) 75 MG 24 hr capsule Take 1  capsule (75 mg total) by mouth daily.  30 capsule  6  . donepezil (ARICEPT) 5 MG tablet Take 1 tablet (5 mg total) by mouth at bedtime as needed.  30 tablet  6  . EVISTA 60 MG tablet Take 60 mg by mouth daily.       . [EXPIRED] pneumococcal 23 valent vaccine (PNU-IMMUNE) injection 0.5 mL        BP 120/68  Pulse 68  Temp 97.9 F (36.6 C) (Oral)  Resp 16  Wt 148 lb 12 oz (67.473 kg)  SpO2 98%  Review of Systems  Constitutional: Negative for fever, chills, appetite change, fatigue and unexpected weight change.  HENT: Negative for ear pain, congestion, sore throat, trouble swallowing, neck pain, voice change and sinus pressure.   Eyes: Negative for visual disturbance.  Respiratory: Negative for cough, shortness of breath, wheezing and stridor.   Cardiovascular: Negative for chest pain, palpitations and leg swelling.  Gastrointestinal: Negative for nausea, vomiting, abdominal pain, diarrhea, constipation, blood in stool, abdominal distention and anal bleeding.  Genitourinary: Negative for dysuria and flank pain.  Musculoskeletal: Negative for myalgias, arthralgias and gait problem.  Skin: Negative for color change and rash.  Neurological: Negative for dizziness and headaches.  Hematological: Negative for adenopathy. Does not bruise/bleed easily.  Psychiatric/Behavioral: Positive for confusion, dysphoric mood and decreased concentration. Negative for suicidal ideas and sleep disturbance. The patient is not nervous/anxious.        Objective:   Physical Exam  Constitutional: She is oriented to person, place, and time. She appears well-developed and well-nourished. No distress.  HENT:  Head: Normocephalic and atraumatic.  Right Ear: External ear normal.  Left Ear: External ear normal.  Nose: Nose normal.  Mouth/Throat: Oropharynx is clear and moist. No oropharyngeal exudate.  Eyes: Conjunctivae normal are normal. Pupils are equal, round, and reactive to light. Right eye exhibits no  discharge. Left eye exhibits no discharge. No scleral icterus.  Neck: Normal range of motion. Neck supple. No tracheal deviation present. No thyromegaly present.  Cardiovascular: Normal rate, regular rhythm, normal heart sounds and intact distal pulses.  Exam reveals no gallop and no friction rub.   No murmur heard. Pulmonary/Chest: Effort normal and breath sounds normal. No respiratory distress. She has no wheezes. She has no rales. She exhibits no tenderness.  Musculoskeletal: Normal range of motion. She exhibits no edema and no tenderness.  Lymphadenopathy:    She has no cervical adenopathy.  Neurological: She is alert and oriented to person, place, and time. No cranial nerve deficit. She exhibits normal muscle tone. Coordination normal.  Skin: Skin is warm and dry. No rash noted. She is not diaphoretic. No erythema. No pallor.  Psychiatric: She has a normal mood and affect. Her behavior is normal. Judgment and thought content normal. Her speech is delayed. She exhibits abnormal recent memory.          Assessment & Plan:

## 2012-03-13 NOTE — Assessment & Plan Note (Signed)
Symptoms of memory loss have been persistent. Will start Aricept. Patient will call if any problems at this medication. Followup in 3 months or sooner if needed.

## 2012-06-16 ENCOUNTER — Ambulatory Visit (INDEPENDENT_AMBULATORY_CARE_PROVIDER_SITE_OTHER): Payer: Medicare Other | Admitting: Internal Medicine

## 2012-06-16 ENCOUNTER — Encounter: Payer: Self-pay | Admitting: Internal Medicine

## 2012-06-16 VITALS — BP 132/68 | HR 69 | Temp 98.0°F | Wt 146.0 lb

## 2012-06-16 DIAGNOSIS — F411 Generalized anxiety disorder: Secondary | ICD-10-CM | POA: Insufficient documentation

## 2012-06-16 DIAGNOSIS — R413 Other amnesia: Secondary | ICD-10-CM

## 2012-06-16 DIAGNOSIS — D649 Anemia, unspecified: Secondary | ICD-10-CM

## 2012-06-16 DIAGNOSIS — M810 Age-related osteoporosis without current pathological fracture: Secondary | ICD-10-CM

## 2012-06-16 DIAGNOSIS — K5190 Ulcerative colitis, unspecified, without complications: Secondary | ICD-10-CM

## 2012-06-16 LAB — COMPREHENSIVE METABOLIC PANEL
ALT: 11 U/L (ref 0–35)
AST: 17 U/L (ref 0–37)
Albumin: 3.6 g/dL (ref 3.5–5.2)
Alkaline Phosphatase: 91 U/L (ref 39–117)
Calcium: 9 mg/dL (ref 8.4–10.5)
Chloride: 105 mEq/L (ref 96–112)
Potassium: 3.6 mEq/L (ref 3.5–5.1)
Sodium: 141 mEq/L (ref 135–145)
Total Protein: 6.4 g/dL (ref 6.0–8.3)

## 2012-06-16 LAB — CBC WITH DIFFERENTIAL/PLATELET
Basophils Absolute: 0 10*3/uL (ref 0.0–0.1)
Basophils Relative: 0.2 % (ref 0.0–3.0)
Eosinophils Absolute: 0 10*3/uL (ref 0.0–0.7)
Lymphocytes Relative: 25.2 % (ref 12.0–46.0)
MCHC: 33.8 g/dL (ref 30.0–36.0)
MCV: 94.4 fl (ref 78.0–100.0)
Monocytes Absolute: 0.4 10*3/uL (ref 0.1–1.0)
Neutrophils Relative %: 67.7 % (ref 43.0–77.0)
Platelets: 201 10*3/uL (ref 150.0–400.0)
RDW: 12.4 % (ref 11.5–14.6)

## 2012-06-16 MED ORDER — LORAZEPAM 0.5 MG PO TABS: 0.5000 mg | ORAL_TABLET | Freq: Three times a day (TID) | ORAL | Status: DC | PRN

## 2012-06-16 NOTE — Progress Notes (Signed)
Subjective:    Patient ID: Hannah Santos, female    DOB: [birth year], 79 y.o.   MRN: [id number]  HPI 79YO female presents for follow up.  Memory loss - Symptoms seem stable on Aricept. No side effects noted.  Anxiety/Depression - Recent increase in anxiety with family staying with her over the weekend. Has taken Lorazepam with some improvement. No side effects noted. Continues on Effexor.  Osteoporosis - Reports she has not been taking Evista. Unsure how long off medication. No recent falls. Last bone density 03/2011.  Crohns - Stopped budesonide. Symptomatically doing well. No diarrhea, abdominal pain.  Outpatient Encounter Prescriptions as of 06/16/2012  Medication Sig Dispense Refill  . acetaminophen (TYLENOL) 500 MG tablet Take 500 mg by mouth every 6 (six) hours as needed. 2 tablets       . B Complex-C (B-COMPLEX WITH VITAMIN C) tablet Take 1 tablet by mouth daily.        . Cholecalciferol (VITAMIN D3) 1000 UNITS CAPS Take 1 tablet by mouth daily.        . cyanocobalamin (,VITAMIN B-12,) 1000 MCG/ML injection Inject 1 mL (1,000 mcg total) into the skin every 30 (thirty) days.  10 mL  1  . DUREZOL 0.05 % EMUL Place 0.5 drops into the left eye daily.       Marland Kitchen GLUCOSAMINE-CHONDROITIN PO Take 1 tablet by mouth daily.        Marland Kitchen loratadine (CLARITIN) 10 MG tablet Take 10 mg by mouth daily as needed.        Marland Kitchen LORazepam (ATIVAN) 0.5 MG tablet Take 1 tablet (0.5 mg total) by mouth every 8 (eight) hours as needed.  90 tablet  3  . Omega-3 Fatty Acids (FISH OIL) 1200 MG CAPS Take 1 capsule by mouth daily.        . Simethicone (GAS RELIEF PO) Take 2 tablets by mouth as needed.        . SYRINGE-NEEDLE, DISP, 3 ML (B-D INTEGRA SYRINGE) 25G X 5/8" 3 ML MISC Use with b12  50 each  0  . venlafaxine XR (EFFEXOR-XR) 150 MG 24 hr capsule Take 1 capsule (150 mg total) by mouth daily.  30 capsule  6  . venlafaxine XR (EFFEXOR-XR) 75 MG 24 hr capsule Take 1 capsule (75 mg total) by mouth daily.  30 capsule   6  . [DISCONTINUED] LORazepam (ATIVAN) 0.5 MG tablet 3 (three) times daily as needed.       . donepezil (ARICEPT) 5 MG tablet Take 1 tablet (5 mg total) by mouth at bedtime as needed.  30 tablet  6  . [DISCONTINUED] budesonide (ENTOCORT EC) 3 MG 24 hr capsule Take 3 mg by mouth every other day.       . [DISCONTINUED] EVISTA 60 MG tablet Take 60 mg by mouth daily.        No facility-administered encounter medications on file as of 06/16/2012.   BP 132/68  Pulse 69  Temp(Src) 98 F (36.7 C) (Oral)  Wt 146 lb (66.225 kg)  BMI 23.58 kg/m2  SpO2 97%  Review of Systems  Constitutional: Negative for fever, chills, appetite change, fatigue and unexpected weight change.  HENT: Negative for ear pain, congestion, sore throat, trouble swallowing, neck pain, voice change and sinus pressure.   Eyes: Negative for visual disturbance.  Respiratory: Negative for cough, shortness of breath, wheezing and stridor.   Cardiovascular: Negative for chest pain, palpitations and leg swelling.  Gastrointestinal: Negative for nausea, vomiting, abdominal pain, diarrhea, constipation,  blood in stool, abdominal distention and anal bleeding.  Genitourinary: Negative for dysuria and flank pain.  Musculoskeletal: Negative for myalgias, arthralgias and gait problem.  Skin: Negative for color change and rash.  Neurological: Negative for dizziness and headaches.  Hematological: Negative for adenopathy. Does not bruise/bleed easily.  Psychiatric/Behavioral: Positive for decreased concentration. Negative for suicidal ideas, sleep disturbance and dysphoric mood. The patient is nervous/anxious.        Objective:   Physical Exam  Constitutional: She is oriented to person, place, and time. She appears well-developed and well-nourished. No distress.  HENT:  Head: Normocephalic and atraumatic.  Right Ear: External ear normal.  Left Ear: External ear normal.  Nose: Nose normal.  Mouth/Throat: Oropharynx is clear and moist.  No oropharyngeal exudate.  Eyes: Conjunctivae are normal. Pupils are equal, round, and reactive to light. Right eye exhibits no discharge. Left eye exhibits no discharge. No scleral icterus.  Neck: Normal range of motion. Neck supple. No tracheal deviation present. No thyromegaly present.  Cardiovascular: Normal rate, regular rhythm, normal heart sounds and intact distal pulses.  Exam reveals no gallop and no friction rub.   No murmur heard. Pulmonary/Chest: Effort normal and breath sounds normal. No respiratory distress. She has no wheezes. She has no rales. She exhibits no tenderness. Right breast exhibits no inverted nipple, no mass, no nipple discharge, no skin change and no tenderness. Left breast exhibits no inverted nipple, no mass, no nipple discharge, no skin change and no tenderness.  Musculoskeletal: Normal range of motion. She exhibits no edema and no tenderness.  Lymphadenopathy:    She has no cervical adenopathy.  Neurological: She is alert and oriented to person, place, and time. No cranial nerve deficit. She exhibits normal muscle tone. Coordination normal.  Skin: Skin is warm and dry. No rash noted. She is not diaphoretic. No erythema. No pallor.  Psychiatric: Her speech is normal and behavior is normal. Judgment and thought content normal. Her mood appears anxious. She exhibits abnormal recent memory.          Assessment & Plan:

## 2012-06-16 NOTE — Assessment & Plan Note (Signed)
Symptoms of memory loss stable on Aricept. Will continue.

## 2012-06-16 NOTE — Assessment & Plan Note (Signed)
Symptoms improved and pt has tapered herself off Budesonide. Discussed use of Probiotic such as Align. Follow up with Dr. Markham Jordan as scheduled next month.

## 2012-06-16 NOTE — Assessment & Plan Note (Signed)
Bilateral breast soreness. H/o left breast cancer. Exam is normal today except for density around scar left breast. Will set up bilateral diagnostic mammogram. Follow up 4 weeks.

## 2012-06-16 NOTE — Assessment & Plan Note (Signed)
Recent increase in anxiety with family staying with her in home. Will continue prn Lorazepam as this has worked well for her. Follow up 4 weeks and prn.

## 2012-06-16 NOTE — Assessment & Plan Note (Signed)
Pt has not been taking Evista, unsure for how long. Bone density testing 03/2011 showed T-score -4, recommended repeat in 1 year. Will repeat and will look into insurance coverage for Prolia. Will check Vit D with labs today. Followup 4 week.

## 2012-06-17 LAB — VITAMIN D 25 HYDROXY (VIT D DEFICIENCY, FRACTURES): Vit D, 25-Hydroxy: 39 ng/mL (ref 30–89)

## 2012-07-07 ENCOUNTER — Ambulatory Visit: Payer: Self-pay | Admitting: Internal Medicine

## 2012-07-08 ENCOUNTER — Encounter: Payer: Self-pay | Admitting: Internal Medicine

## 2012-07-25 ENCOUNTER — Encounter: Payer: Self-pay | Admitting: Internal Medicine

## 2012-07-25 ENCOUNTER — Ambulatory Visit (INDEPENDENT_AMBULATORY_CARE_PROVIDER_SITE_OTHER): Payer: Medicare Other | Admitting: Internal Medicine

## 2012-07-25 VITALS — BP 142/70 | HR 70 | Temp 98.6°F | Wt 144.0 lb

## 2012-07-25 DIAGNOSIS — D649 Anemia, unspecified: Secondary | ICD-10-CM

## 2012-07-25 DIAGNOSIS — N644 Mastodynia: Secondary | ICD-10-CM

## 2012-07-25 DIAGNOSIS — K5190 Ulcerative colitis, unspecified, without complications: Secondary | ICD-10-CM

## 2012-07-25 DIAGNOSIS — M1990 Unspecified osteoarthritis, unspecified site: Secondary | ICD-10-CM

## 2012-07-25 DIAGNOSIS — M810 Age-related osteoporosis without current pathological fracture: Secondary | ICD-10-CM

## 2012-07-25 DIAGNOSIS — R413 Other amnesia: Secondary | ICD-10-CM

## 2012-07-25 MED ORDER — DENOSUMAB 60 MG/ML ~~LOC~~ SOLN
60.0000 mg | Freq: Once | SUBCUTANEOUS | Status: AC
  Administered 2012-07-25: 60 mg via SUBCUTANEOUS

## 2012-07-25 NOTE — Assessment & Plan Note (Signed)
Will proceed with Prolia today. Next dose due 01/2013. Recommended 1200mg  calcium intake daily and continued 1000units vit d daily.

## 2012-07-25 NOTE — Assessment & Plan Note (Signed)
Pt reports recent mammogram was normal. Symptoms have improved. Will request results on mammogram.

## 2012-07-25 NOTE — Progress Notes (Signed)
Subjective:    Patient ID: Hannah Santos, female    DOB: 09-24-1930, 79 y.o.   MRN: [id number]  HPI 79 year old female with history of memory loss, ulcerative colitis, osteoarthritis bilateral knees, and osteoporosis presents for followup. She reports that the pain in her knees has gradually gotten worse. She is scheduled for orthopedic surgery evaluation later this week. She continues to take Tylenol as needed for severe pain. She has minimal improvement with this. Pain in her knees limits her ability to ambulate.  In regards to ulcerative colitis, she is currently using probiotic alone and reports that symptoms have been relatively stable with only intermittent watery stools. She denies any chronic abdominal pain, blood in her stool.  In regards to memory loss, she reports that symptoms have been relatively stable. She continues on Aricept.  In regards to osteoporosis, she would like to proceed with Prolia injection today.   Outpatient Encounter Prescriptions as of 07/25/2012  Medication Sig Dispense Refill  . acetaminophen (TYLENOL) 500 MG tablet Take 500 mg by mouth every 6 (six) hours as needed. 2 tablets       . B Complex-C (B-COMPLEX WITH VITAMIN C) tablet Take 1 tablet by mouth daily.        . Cholecalciferol (VITAMIN D3) 1000 UNITS CAPS Take 1 tablet by mouth daily.        . DUREZOL 0.05 % EMUL Place 0.5 drops into the left eye daily.       Marland Kitchen GLUCOSAMINE-CHONDROITIN PO Take 1 tablet by mouth daily.        Marland Kitchen loratadine (CLARITIN) 10 MG tablet Take 10 mg by mouth daily as needed.        Marland Kitchen LORazepam (ATIVAN) 0.5 MG tablet Take 1 tablet (0.5 mg total) by mouth every 8 (eight) hours as needed.  90 tablet  3  . Omega-3 Fatty Acids (FISH OIL) 1200 MG CAPS Take 1 capsule by mouth daily.        . Simethicone (GAS RELIEF PO) Take 2 tablets by mouth as needed.        . SYRINGE-NEEDLE, DISP, 3 ML (B-D INTEGRA SYRINGE) 25G X 5/8" 3 ML MISC Use with b12  50 each  0  . venlafaxine XR  (EFFEXOR-XR) 150 MG 24 hr capsule Take 1 capsule (150 mg total) by mouth daily.  30 capsule  6  . venlafaxine XR (EFFEXOR-XR) 75 MG 24 hr capsule Take 1 capsule (75 mg total) by mouth daily.  30 capsule  6  . donepezil (ARICEPT) 5 MG tablet Take 1 tablet (5 mg total) by mouth at bedtime as needed.  30 tablet  6  . [EXPIRED] denosumab (PROLIA) injection 60 mg        No facility-administered encounter medications on file as of 07/25/2012.  BP 142/70  Pulse 70  Temp(Src) 98.6 F (37 C) (Oral)  Wt 144 lb (65.318 kg)  BMI 23.25 kg/m2  SpO2 97%  Review of Systems  Constitutional: Negative for fever, chills, appetite change, fatigue and unexpected weight change.  HENT: Negative for ear pain, congestion, sore throat, trouble swallowing, neck pain, voice change and sinus pressure.   Eyes: Negative for visual disturbance.  Respiratory: Negative for cough, shortness of breath, wheezing and stridor.   Cardiovascular: Negative for chest pain, palpitations and leg swelling.  Gastrointestinal: Positive for diarrhea (intermittent chronic). Negative for nausea, vomiting, abdominal pain, constipation, blood in stool, abdominal distention and anal bleeding.  Genitourinary: Negative for dysuria and flank pain.  Musculoskeletal: Negative for  myalgias, arthralgias and gait problem.  Skin: Negative for color change and rash.  Neurological: Negative for dizziness and headaches.  Hematological: Negative for adenopathy. Does not bruise/bleed easily.  Psychiatric/Behavioral: Positive for decreased concentration. Negative for suicidal ideas, sleep disturbance and dysphoric mood. The patient is not nervous/anxious.        Objective:   Physical Exam  Constitutional: She is oriented to person, place, and time. She appears well-developed and well-nourished. No distress.  HENT:  Head: Normocephalic and atraumatic.  Right Ear: External ear normal.  Left Ear: External ear normal.  Nose: Nose normal.  Mouth/Throat:  Oropharynx is clear and moist. No oropharyngeal exudate.  Eyes: Conjunctivae are normal. Pupils are equal, round, and reactive to light. Right eye exhibits no discharge. Left eye exhibits no discharge. No scleral icterus.  Neck: Normal range of motion. Neck supple. No tracheal deviation present. No thyromegaly present.  Cardiovascular: Normal rate, regular rhythm, normal heart sounds and intact distal pulses.  Exam reveals no gallop and no friction rub.   No murmur heard. Pulmonary/Chest: Effort normal and breath sounds normal. No accessory muscle usage. Not tachypneic. No respiratory distress. She has no decreased breath sounds. She has no wheezes. She has no rhonchi. She has no rales. She exhibits no tenderness.  Musculoskeletal: Normal range of motion. She exhibits no edema and no tenderness.  Lymphadenopathy:    She has no cervical adenopathy.  Neurological: She is alert and oriented to person, place, and time. No cranial nerve deficit. She exhibits normal muscle tone. Coordination normal.  Skin: Skin is warm and dry. No rash noted. She is not diaphoretic. No erythema. No pallor.  Psychiatric: She has a normal mood and affect. Her behavior is normal. Judgment and thought content normal.          Assessment & Plan:

## 2012-07-25 NOTE — Assessment & Plan Note (Signed)
Symptoms have been relatively stable with Aricept. Will continue.

## 2012-07-25 NOTE — Assessment & Plan Note (Signed)
Symptoms well controlled with use of probiotic. Will continue.

## 2012-07-25 NOTE — Assessment & Plan Note (Signed)
Bilateral knees. Scheduled for orthopedic evaluation with possible steroid injection next week.

## 2012-07-26 ENCOUNTER — Ambulatory Visit: Payer: Medicare Other

## 2012-07-26 DIAGNOSIS — D649 Anemia, unspecified: Secondary | ICD-10-CM

## 2012-07-26 LAB — CBC WITH DIFFERENTIAL/PLATELET
Eosinophils Relative: 0.7 % (ref 0.0–5.0)
Lymphocytes Relative: 26.3 % (ref 12.0–46.0)
MCV: 94.3 fl (ref 78.0–100.0)
Monocytes Absolute: 0.4 10*3/uL (ref 0.1–1.0)
Neutrophils Relative %: 66.4 % (ref 43.0–77.0)
Platelets: 181 10*3/uL (ref 150.0–400.0)
RBC: 3.72 Mil/uL — ABNORMAL LOW (ref 3.87–5.11)
WBC: 6.2 10*3/uL (ref 4.5–10.5)

## 2012-07-26 LAB — FERRITIN: Ferritin: 68.4 ng/mL (ref 10.0–291.0)

## 2012-08-04 ENCOUNTER — Encounter: Payer: Self-pay | Admitting: Internal Medicine

## 2012-08-07 ENCOUNTER — Emergency Department: Payer: Self-pay | Admitting: Emergency Medicine

## 2012-08-07 LAB — CBC
HCT: 38.7 % (ref 35.0–47.0)
HGB: 13.3 g/dL (ref 12.0–16.0)
MCH: 32.1 pg (ref 26.0–34.0)
MCHC: 34.3 g/dL (ref 32.0–36.0)
MCV: 94 fL (ref 80–100)
RBC: 4.14 10*6/uL (ref 3.80–5.20)
RDW: 12.5 % (ref 11.5–14.5)

## 2012-08-07 LAB — COMPREHENSIVE METABOLIC PANEL
Albumin: 3.7 g/dL (ref 3.4–5.0)
Anion Gap: 7 (ref 7–16)
BUN: 13 mg/dL (ref 7–18)
Bilirubin,Total: 0.6 mg/dL (ref 0.2–1.0)
Chloride: 110 mmol/L — ABNORMAL HIGH (ref 98–107)
Co2: 25 mmol/L (ref 21–32)
Creatinine: 0.79 mg/dL (ref 0.60–1.30)
EGFR (African American): >60
EGFR (Non-African Amer.): >60
Glucose: 117 mg/dL — ABNORMAL HIGH (ref 65–99)
Osmolality: 284 (ref 275–301)
Potassium: 3.8 mmol/L (ref 3.5–5.1)
SGPT (ALT): 32 U/L (ref 12–78)
Sodium: 142 mmol/L (ref 136–145)

## 2012-08-07 LAB — URINALYSIS, COMPLETE
Glucose,UR: NEGATIVE mg/dL (ref 0–75)
Leukocyte Esterase: NEGATIVE

## 2012-08-07 LAB — TROPONIN I: Troponin-I: <0.02 ng/mL

## 2012-08-08 ENCOUNTER — Telehealth: Payer: Self-pay | Admitting: Internal Medicine

## 2012-08-08 NOTE — Telephone Encounter (Signed)
Spoke with pt, denies palpitations, chest pain, shortness of breath today. States feels better today. Followup scheduled with Raquel 08/09/12.

## 2012-08-08 NOTE — Telephone Encounter (Signed)
Patient Information:  Caller Name: [place]  Phone: [phone]  Patient: Hannah Santos, Hannah Santos  Gender: Female  DOB: [birth year]  Age: 79 Years  PCP: Ronna Polio (Adults only)  Office Follow Up:  Does the office need to follow up with this patient?: Yes  Instructions For The Office: Please schedule follow up for this patient.  She gave very little information, denies any current symptoms other than feeling tired.  Thank you.   Symptoms  Reason For Call & Symptoms: Patient calling. States she was seen in ED 08/07/12 and told to follow up with PCP.  She states her heart was racing and she had buzzing in her ears.  She was told that her heart was fine.  She states, "I'm sleepy" as the only symptom at time of call.  Patient states she would like to discuss dose of Effexor with Dr. Dan Humphreys and that she was diagnosed with anxiety at [facility], Dr. Mayford Knife.   Emergent symptoms ruled out.  Home care for the interim; note to office for follow up and scheduling.  Reviewed Health History In EMR: Yes  Reviewed Medications In EMR: Yes  Reviewed Allergies In EMR: Yes  Reviewed Surgeries / Procedures: Yes  Date of Onset of Symptoms: 08/07/2012  Guideline(s) Used:  No Protocol Available - Sick Adult  Disposition Per Guideline:   Discuss with PCP and Callback by Nurse Today  Reason For Disposition Reached:   Nursing judgment  Advice Given:  Call Back If:  New symptoms develop  You become worse.  Patient Will Follow Care Advice:  YES

## 2012-08-09 ENCOUNTER — Ambulatory Visit (INDEPENDENT_AMBULATORY_CARE_PROVIDER_SITE_OTHER): Payer: Medicare Other | Admitting: Adult Health

## 2012-08-09 ENCOUNTER — Encounter: Payer: Self-pay | Admitting: Adult Health

## 2012-08-09 VITALS — BP 128/60 | HR 90 | Temp 98.1°F | Resp 12 | Wt 139.0 lb

## 2012-08-09 DIAGNOSIS — F411 Generalized anxiety disorder: Secondary | ICD-10-CM

## 2012-08-09 DIAGNOSIS — F329 Major depressive disorder, single episode, unspecified: Secondary | ICD-10-CM

## 2012-08-09 DIAGNOSIS — F32A Depression, unspecified: Secondary | ICD-10-CM

## 2012-08-09 MED ORDER — LORAZEPAM 0.5 MG PO TABS: 0.5000 mg | ORAL_TABLET | Freq: Three times a day (TID) | ORAL | Status: DC | PRN

## 2012-08-09 NOTE — Progress Notes (Signed)
Subjective:    Patient ID: Hannah Santos, female    DOB: 11-Feb-1931, 79 y.o.   MRN: [id number]  HPI  Patient is a pleasant 79 year old female who presents to clinic with complaint of anxiety and feeling "jittery". She recently was at [facility] emergency room for these symptoms. She was advised to followup with her PCP. She denies shortness of breath, chest pain. Patient decreased her dose of Effexor to 150 mg.  She reports increased anxiety related to family issues. Patient is considering selling her home so that she can move into an assisted living facility; however, she reports her daughter is opposed to this. Patient reports "my daughter wants my home". Patient feels alone and isolated in her home and feels that moving into an assisted living would not only improve her quality of life but she would have medical assistance if needed. Patient further reports that she has been seeing an attorney for a will and other legal documents. She is feeling stressed because she does not want to go against her daughter.  Patient also reports that she received a bilateral knee steroid injections on August 02, 2012. She is wondering if the steroid could also be contributing to some of her symptoms.   Current Outpatient Prescriptions on File Prior to Visit  Medication Sig Dispense Refill  . acetaminophen (TYLENOL) 500 MG tablet Take 500 mg by mouth every 6 (six) hours as needed. 2 tablets       . B Complex-C (B-COMPLEX WITH VITAMIN C) tablet Take 1 tablet by mouth daily.        . Cholecalciferol (VITAMIN D3) 1000 UNITS CAPS Take 1 tablet by mouth daily.        . DUREZOL 0.05 % EMUL Place 0.5 drops into the left eye daily.       Marland Kitchen GLUCOSAMINE-CHONDROITIN PO Take 1 tablet by mouth daily.        Marland Kitchen loratadine (CLARITIN) 10 MG tablet Take 10 mg by mouth daily as needed.        . Omega-3 Fatty Acids (FISH OIL) 1200 MG CAPS Take 1 capsule by mouth daily.        . Simethicone (GAS RELIEF PO) Take 2 tablets by mouth as  needed.        . SYRINGE-NEEDLE, DISP, 3 ML (B-D INTEGRA SYRINGE) 25G X 5/8" 3 ML MISC Use with b12  50 each  0  . venlafaxine XR (EFFEXOR-XR) 150 MG 24 hr capsule Take 1 capsule (150 mg total) by mouth daily.  30 capsule  6  . venlafaxine XR (EFFEXOR-XR) 75 MG 24 hr capsule Take 1 capsule (75 mg total) by mouth daily.  30 capsule  6  . donepezil (ARICEPT) 5 MG tablet Take 1 tablet (5 mg total) by mouth at bedtime as needed.  30 tablet  6   No current facility-administered medications on file prior to visit.     Review of Systems  Constitutional: Negative for fever and chills.  Respiratory: Negative for cough, chest tightness, shortness of breath and wheezing.   Cardiovascular: Negative for chest pain and leg swelling.  Gastrointestinal: Negative.   Neurological: Negative.   Psychiatric/Behavioral: Negative for hallucinations, behavioral problems, confusion and agitation. The patient is nervous/anxious.     BP 128/60  Pulse 90  Temp(Src) 98.1 F (36.7 C) (Oral)  Resp 12  Wt 139 lb (63.05 kg)  BMI 22.45 kg/m2  SpO2 93%    Objective:   Physical Exam  Constitutional: She is oriented to person,  place, and time. She appears well-developed and well-nourished. No distress.  HENT:  Head: Normocephalic and atraumatic.  Eyes: Conjunctivae are normal. Pupils are equal, round, and reactive to light.  Cardiovascular: Normal rate, regular rhythm, normal heart sounds and intact distal pulses.  Exam reveals no gallop.   No murmur heard. Pulmonary/Chest: Effort normal and breath sounds normal. No respiratory distress. She has no wheezes. She has no rales. She exhibits no tenderness.  Musculoskeletal: She exhibits no edema and no tenderness.  Lymphadenopathy:    She has no cervical adenopathy.  Neurological: She is alert and oriented to person, place, and time.  Skin: Skin is warm and dry.  Psychiatric: Her behavior is normal. Judgment and thought content normal.  Appears sad.       Assessment & Plan:

## 2012-08-09 NOTE — Patient Instructions (Addendum)
  Continue your Effexor 150 mg daily.  Your heart rate and rhythm were regular and normal.  The steroid injections you received recently could be causing some of your symptoms. Once these symptoms improve you may increase your Effexor again and add the 75 mg tablet. If you feel you are doing well on the 150mg , you may continue this dose.  Please call the office if you are not feeling any better within 1 week or sooner if your symptoms become worse.

## 2012-08-09 NOTE — Assessment & Plan Note (Signed)
Symptoms do not appear to be aggravated with lower dose Effexor. She will continue Effexor 150 mg for now. She will add 75 mg back if her symptoms of depression worsen. Followup if symptoms do not improve.

## 2012-08-09 NOTE — Assessment & Plan Note (Addendum)
Anxiety secondary to family issues. Please see history of present illness. She has been taking lorazepam at bedtime and will occasionally take one during the day. Refill provided. Note, that her recent steroid injections may be contributing to her feeling "jittery".

## 2012-09-01 ENCOUNTER — Other Ambulatory Visit: Payer: Self-pay | Admitting: Internal Medicine

## 2012-10-10 ENCOUNTER — Telehealth: Payer: Self-pay | Admitting: Internal Medicine

## 2012-10-10 DIAGNOSIS — R413 Other amnesia: Secondary | ICD-10-CM

## 2012-10-10 NOTE — Telephone Encounter (Signed)
Memory loss is something that we have been discussing with Ms. Hannah Santos at each visit.  If the family feels that there is a recent change or significant decline, then we could set up neurocognitive evaluation. This would be at [facility].

## 2012-10-10 NOTE — Telephone Encounter (Signed)
Patients daughter called stating that the patient needs to see a neurologist "fast". Friends and family are noticing that the patient forgetful, loosing words quite often. She cannot carry a conversation, and now starting to "cut all ties" with people. The patients daughter stated she believes that she has dementia. Patients daughter stated that her mother has a unsteady gait and is loosing a lot of weight. Please advise.

## 2012-10-11 NOTE — Telephone Encounter (Signed)
She should also be seen in follow up here.

## 2012-10-11 NOTE — Telephone Encounter (Signed)
Spoke with patient daughter she stated Hannah Santos has been showing more signs of declining and thoughts that people are trying to steal her money or take her car. She would like to go ahead with a referral and prefer if this was suggested by you then the patient would be susceptible to go. And if she found out they were sticking their nose in interfering then she would be more hesitant and upset with them.

## 2012-10-11 NOTE — Telephone Encounter (Signed)
LMTCB

## 2012-10-12 NOTE — Telephone Encounter (Signed)
She has an appointment on 7/24, does it need to be sooner?

## 2012-10-12 NOTE — Telephone Encounter (Signed)
That should be fine unless acute issues

## 2012-10-27 ENCOUNTER — Ambulatory Visit: Payer: Medicare Other | Admitting: Internal Medicine

## 2012-11-11 ENCOUNTER — Ambulatory Visit (INDEPENDENT_AMBULATORY_CARE_PROVIDER_SITE_OTHER): Payer: Medicare Other | Admitting: Internal Medicine

## 2012-11-11 ENCOUNTER — Encounter: Payer: Self-pay | Admitting: Internal Medicine

## 2012-11-11 VITALS — BP 138/78 | HR 64 | Temp 98.2°F | Wt 138.0 lb

## 2012-11-11 DIAGNOSIS — G250 Essential tremor: Secondary | ICD-10-CM

## 2012-11-11 DIAGNOSIS — F411 Generalized anxiety disorder: Secondary | ICD-10-CM

## 2012-11-11 DIAGNOSIS — R209 Unspecified disturbances of skin sensation: Secondary | ICD-10-CM

## 2012-11-11 DIAGNOSIS — R202 Paresthesia of skin: Secondary | ICD-10-CM

## 2012-11-11 DIAGNOSIS — R413 Other amnesia: Secondary | ICD-10-CM

## 2012-11-11 DIAGNOSIS — G252 Other specified forms of tremor: Secondary | ICD-10-CM

## 2012-11-11 LAB — CBC WITH DIFFERENTIAL/PLATELET
Basophils Relative: 0.1 % (ref 0.0–3.0)
Eosinophils Relative: 1.4 % (ref 0.0–5.0)
Lymphocytes Relative: 21.3 % (ref 12.0–46.0)
Neutrophils Relative %: 69.8 % (ref 43.0–77.0)
Platelets: 209 10*3/uL (ref 150.0–400.0)
RBC: 3.85 Mil/uL — ABNORMAL LOW (ref 3.87–5.11)
WBC: 7.9 10*3/uL (ref 4.5–10.5)

## 2012-11-11 LAB — VITAMIN B12: Vitamin B-12: 1088 pg/mL — ABNORMAL HIGH (ref 211–911)

## 2012-11-11 LAB — COMPREHENSIVE METABOLIC PANEL
ALT: 11 U/L (ref 0–35)
AST: 14 U/L (ref 0–37)
CO2: 27 mEq/L (ref 19–32)
Calcium: 8.9 mg/dL (ref 8.4–10.5)
Chloride: 105 mEq/L (ref 96–112)
Creatinine, Ser: 0.8 mg/dL (ref 0.4–1.2)
GFR: 76.7 mL/min (ref >60.00)
Sodium: 139 mEq/L (ref 135–145)
Total Bilirubin: 0.4 mg/dL (ref 0.3–1.2)
Total Protein: 6.2 g/dL (ref 6.0–8.3)

## 2012-11-11 LAB — TSH: TSH: 1.29 u[IU]/mL (ref 0.35–5.50)

## 2012-11-11 MED ORDER — MIRTAZAPINE 15 MG PO TBDP: 15.0000 mg | ORAL_TABLET | Freq: Every day | ORAL | Status: DC

## 2012-11-11 MED ORDER — LORAZEPAM 0.5 MG PO TABS: 0.5000 mg | ORAL_TABLET | Freq: Three times a day (TID) | ORAL | Status: DC | PRN

## 2012-11-11 NOTE — Assessment & Plan Note (Signed)
Pt notes paresthesia of bilateral toes. Exam is normal today including sensation to temperature, monofilament. Reflexes are normal. Will check blood sugar, TSH, and B12 with labs. Pt drinks city water, so should not be at risk of heavy metal exposure.

## 2012-11-11 NOTE — Patient Instructions (Signed)
Start Remeron at bedtime to help with sleep and appetite.  Follow up with [facility] as scheduled.  Follow up here in 4 weeks.

## 2012-11-11 NOTE — Assessment & Plan Note (Signed)
Progressive memory loss. No changes noted with Aricept. Previous lab evaluation including testing of thyroid function and B12 were normal. Will repeat today. Awaiting evaluation with neurocognitive testing next month at [facility].

## 2012-11-11 NOTE — Assessment & Plan Note (Signed)
Persistent symptoms of debilitating anxiety/panic. Some improvement with occasional use of Lorazepam. Discussed potential side effects of this medication and encouraged her to limit use as much as possible. Will add remeron at bedtime to see if any improvement. Also encouraged her to follow up with appointment at [facility] for neurocognitive testing/psychiatric evaluation.

## 2012-11-11 NOTE — Assessment & Plan Note (Signed)
New intention tremor noted bilateral hands. Question if pt may have underlying Parkinson's. Complete neurological evaluation pending at [facility] next month. Will follow up after evaluation complete.

## 2012-11-11 NOTE — Progress Notes (Signed)
Subjective:    Patient ID: Hannah Santos, female    DOB: 03-12-1931, 79 y.o.   MRN: [id number]  HPI 79YO female with h/o memory loss, colitis, anxiety/depression presents for follow up. She reports it has been a difficult time for her. She has been providing support to her daughter, whose husband just passed away.  She reports increased depressed mood and anxiety with some episodes of panic. She has been taking Lorazepam on occasion with minimal improvement. She has difficulty falling asleep and has poor appetite. She also feels that her memory is worsening. She is considering moving to ALF, [facility].  She also is concerned about several weeks of tremor in both of her hands. This has prevented her from being able to write clearly.  This has not been evaluated in the past.  She also notes numbness in her bilateral toes. This has been ongoing for a few weeks. She denies any pain in her feet, or h/o ulceration. No recent falls or trauma.  Outpatient Prescriptions Prior to Visit  Medication Sig Dispense Refill  . acetaminophen (TYLENOL) 500 MG tablet Take 500 mg by mouth every 6 (six) hours as needed. 2 tablets       . B Complex-C (B-COMPLEX WITH VITAMIN C) tablet Take 1 tablet by mouth daily.        . Cholecalciferol (VITAMIN D3) 1000 UNITS CAPS Take 1 tablet by mouth daily.        Marland Kitchen donepezil (ARICEPT) 5 MG tablet Take 1 tablet (5 mg total) by mouth at bedtime as needed.  30 tablet  6  . DUREZOL 0.05 % EMUL Place 0.5 drops into the left eye daily.       Marland Kitchen GLUCOSAMINE-CHONDROITIN PO Take 1 tablet by mouth daily.        Marland Kitchen loratadine (CLARITIN) 10 MG tablet Take 10 mg by mouth daily as needed.        . Omega-3 Fatty Acids (FISH OIL) 1200 MG CAPS Take 1 capsule by mouth daily.        . Simethicone (GAS RELIEF PO) Take 2 tablets by mouth as needed.        . SYRINGE-NEEDLE, DISP, 3 ML (B-D INTEGRA SYRINGE) 25G X 5/8" 3 ML MISC Use with b12  50 each  0  . venlafaxine XR (EFFEXOR-XR) 150 MG 24 hr  capsule Take 1 capsule (150 mg total) by mouth daily.  30 capsule  6  . venlafaxine XR (EFFEXOR-XR) 75 MG 24 hr capsule TAKE 1 CAPSULE (75 MG TOTAL) BY MOUTH DAILY.  30 capsule  5  . LORazepam (ATIVAN) 0.5 MG tablet Take 1 tablet (0.5 mg total) by mouth every 8 (eight) hours as needed.  60 tablet  3   No facility-administered medications prior to visit.   BP 138/78  Pulse 64  Temp(Src) 98.2 F (36.8 C) (Oral)  Wt 138 lb (62.596 kg)  BMI 22.28 kg/m2  SpO2 99%  Review of Systems  Constitutional: Positive for fatigue. Negative for fever, chills, appetite change and unexpected weight change.  HENT: Negative for ear pain, congestion, sore throat, trouble swallowing, neck pain, voice change and sinus pressure.   Eyes: Negative for visual disturbance.  Respiratory: Negative for cough, shortness of breath, wheezing and stridor.   Cardiovascular: Negative for chest pain, palpitations and leg swelling.  Gastrointestinal: Negative for nausea, vomiting, abdominal pain, diarrhea, constipation, blood in stool, abdominal distention and anal bleeding.  Genitourinary: Negative for dysuria and flank pain.  Musculoskeletal: Negative for myalgias,  arthralgias and gait problem.  Skin: Negative for color change and rash.  Neurological: Positive for tremors. Negative for dizziness and headaches.  Hematological: Negative for adenopathy. Does not bruise/bleed easily.  Psychiatric/Behavioral: Positive for confusion, dysphoric mood and decreased concentration. Negative for suicidal ideas and sleep disturbance. The patient is nervous/anxious.        Objective:   Physical Exam  Constitutional: She is oriented to person, place, and time. She appears well-developed and well-nourished. No distress.  HENT:  Head: Normocephalic and atraumatic.  Right Ear: External ear normal.  Left Ear: External ear normal.  Nose: Nose normal.  Mouth/Throat: Oropharynx is clear and moist. No oropharyngeal exudate.  Eyes:  Conjunctivae are normal. Pupils are equal, round, and reactive to light. Right eye exhibits no discharge. Left eye exhibits no discharge. No scleral icterus.  Neck: Normal range of motion. Neck supple. No tracheal deviation present. No thyromegaly present.  Cardiovascular: Normal rate, regular rhythm, normal heart sounds and intact distal pulses.  Exam reveals no gallop and no friction rub.   No murmur heard. Pulmonary/Chest: Effort normal and breath sounds normal. No accessory muscle usage. Not tachypneic. No respiratory distress. She has no decreased breath sounds. She has no wheezes. She has no rhonchi. She has no rales. She exhibits no tenderness.  Musculoskeletal: Normal range of motion. She exhibits no edema and no tenderness.  Lymphadenopathy:    She has no cervical adenopathy.  Neurological: She is alert and oriented to person, place, and time. She displays tremor (intention tremor bilateral hands). She displays no atrophy. No cranial nerve deficit or sensory deficit (sensation intact to light touch, temp LE). She exhibits normal muscle tone. She displays no seizure activity. Coordination and gait normal.  Skin: Skin is warm and dry. No rash noted. She is not diaphoretic. No erythema. No pallor.  Psychiatric: She has a normal mood and affect. Her speech is normal and behavior is normal. Judgment and thought content normal. Cognition and memory are impaired.          Assessment & Plan:

## 2012-11-14 ENCOUNTER — Telehealth: Payer: Self-pay | Admitting: *Deleted

## 2012-11-14 MED ORDER — "SYRINGE/NEEDLE (DISP) 25G X 5/8"" 3 ML MISC": Status: AC

## 2012-11-14 MED ORDER — CYANOCOBALAMIN 1000 MCG/ML IJ SOLN: 1000.0000 ug | Freq: Once | INTRAMUSCULAR | Status: DC

## 2012-11-14 NOTE — Telephone Encounter (Signed)
Prescription sent to the pharmacy.

## 2012-11-14 NOTE — Telephone Encounter (Signed)
Message copied by Theola Sequin on Mon Nov 14, 2012 10:09 AM ------      Message from: Ronna Polio A      Created: Fri Nov 11, 2012  5:01 PM       Labs were all normal. B12 level was elevated, consistent with her recently having her B12 injections. She should continue B12 injections monthly ------

## 2012-12-07 ENCOUNTER — Encounter: Payer: Self-pay | Admitting: *Deleted

## 2012-12-08 ENCOUNTER — Encounter: Payer: Self-pay | Admitting: Internal Medicine

## 2012-12-08 ENCOUNTER — Ambulatory Visit (INDEPENDENT_AMBULATORY_CARE_PROVIDER_SITE_OTHER): Payer: Medicare Other | Admitting: Internal Medicine

## 2012-12-08 VITALS — BP 128/64 | HR 78 | Temp 98.1°F | Wt 140.0 lb

## 2012-12-08 DIAGNOSIS — F329 Major depressive disorder, single episode, unspecified: Secondary | ICD-10-CM

## 2012-12-08 DIAGNOSIS — R413 Other amnesia: Secondary | ICD-10-CM

## 2012-12-08 DIAGNOSIS — F411 Generalized anxiety disorder: Secondary | ICD-10-CM

## 2012-12-08 DIAGNOSIS — Z23 Encounter for immunization: Secondary | ICD-10-CM

## 2012-12-08 MED ORDER — MIRTAZAPINE 30 MG PO TBDP: 30.0000 mg | ORAL_TABLET | Freq: Every day | ORAL | Status: DC

## 2012-12-08 NOTE — Assessment & Plan Note (Signed)
Pt canceled cognitive evaluation at [facility]. Plans to move into memory care facility at [facility]. Encouraged her to follow through with this. Continue Aricept.

## 2012-12-08 NOTE — Assessment & Plan Note (Signed)
Symptoms of depressed mood improved with Remeron, however not completely controlled. Will increase dose to 30mg  po qhs and have pt follow up in 1-3 months.

## 2012-12-08 NOTE — Progress Notes (Signed)
Subjective:    Patient ID: Carolyne Fiscal, female    DOB: [birth year], 79 y.o.   MRN: [id number]  HPI 79 year old female with history of depression, anxiety, memory loss, colitis presents for followup. At her last visit, she was concerned about worsening memory loss. We set up referral to tertiary care Center for cognitive evaluation. In the interim since her last visit, she decided to cancel this evaluation and is planning to move to an assisted living facility with dementia care available. Her family is supportive of this. She has sold her home. She feels comfortable with the decision.  She notes some ongoing issues with depressed mood. She reports improvement in symptoms with use of Remeron, however occasionally still has depressed mood and tearfulness. She questions whether she could increase her dose of this medication.  Outpatient Encounter Prescriptions as of 12/08/2012  Medication Sig Dispense Refill  . acetaminophen (TYLENOL) 500 MG tablet Take 500 mg by mouth every 6 (six) hours as needed. 2 tablets       . B Complex-C (B-COMPLEX WITH VITAMIN C) tablet Take 1 tablet by mouth daily.        . Cholecalciferol (VITAMIN D3) 1000 UNITS CAPS Take 1 tablet by mouth daily.        . cyanocobalamin (,VITAMIN B-12,) 1000 MCG/ML injection Inject 1 mL (1,000 mcg total) into the muscle once.  10 mL  1  . donepezil (ARICEPT) 5 MG tablet Take 1 tablet (5 mg total) by mouth at bedtime as needed.  30 tablet  6  . DUREZOL 0.05 % EMUL Place 0.5 drops into the left eye daily.       Marland Kitchen GLUCOSAMINE-CHONDROITIN PO Take 1 tablet by mouth daily.        Marland Kitchen loratadine (CLARITIN) 10 MG tablet Take 10 mg by mouth daily as needed.        Marland Kitchen LORazepam (ATIVAN) 0.5 MG tablet Take 1 tablet (0.5 mg total) by mouth every 8 (eight) hours as needed.  60 tablet  3  . mirtazapine (REMERON SOL-TAB) 30 MG disintegrating tablet Take 1 tablet (30 mg total) by mouth at bedtime.  30 tablet  3  . Omega-3 Fatty Acids (FISH OIL) 1200 MG  CAPS Take 1 capsule by mouth daily.        . Simethicone (GAS RELIEF PO) Take 2 tablets by mouth as needed.        . SYRINGE-NEEDLE, DISP, 3 ML (B-D INTEGRA SYRINGE) 25G X 5/8" 3 ML MISC Use with b12  50 each  0  . venlafaxine XR (EFFEXOR-XR) 150 MG 24 hr capsule Take 1 capsule (150 mg total) by mouth daily.  30 capsule  6  . [DISCONTINUED] mirtazapine (REMERON SOL-TAB) 15 MG disintegrating tablet Take 1 tablet (15 mg total) by mouth at bedtime.  30 tablet  3  . [DISCONTINUED] venlafaxine XR (EFFEXOR-XR) 75 MG 24 hr capsule TAKE 1 CAPSULE (75 MG TOTAL) BY MOUTH DAILY.  30 capsule  5   No facility-administered encounter medications on file as of 12/08/2012.   BP 128/64  Pulse 78  Temp(Src) 98.1 F (36.7 C) (Oral)  Wt 140 lb (63.504 kg)  BMI 22.61 kg/m2  SpO2 98%  Review of Systems  Constitutional: Negative for fever, chills, appetite change, fatigue and unexpected weight change.  HENT: Negative for ear pain, congestion, sore throat, trouble swallowing, neck pain, voice change and sinus pressure.   Eyes: Negative for visual disturbance.  Respiratory: Negative for cough, shortness of breath, wheezing and stridor.  Cardiovascular: Negative for chest pain, palpitations and leg swelling.  Gastrointestinal: Negative for nausea, vomiting, abdominal pain, diarrhea, constipation, blood in stool, abdominal distention and anal bleeding.  Genitourinary: Negative for dysuria and flank pain.  Musculoskeletal: Negative for myalgias, arthralgias and gait problem.  Skin: Negative for color change and rash.  Neurological: Negative for dizziness and headaches.  Hematological: Negative for adenopathy. Does not bruise/bleed easily.  Psychiatric/Behavioral: Positive for confusion, dysphoric mood and decreased concentration. Negative for suicidal ideas and sleep disturbance. The patient is not nervous/anxious.        Objective:   Physical Exam  Constitutional: She is oriented to person, place, and time.  She appears well-developed and well-nourished. No distress.  HENT:  Head: Normocephalic and atraumatic.  Right Ear: External ear normal.  Left Ear: External ear normal.  Nose: Nose normal.  Mouth/Throat: Oropharynx is clear and moist. No oropharyngeal exudate.  Eyes: Conjunctivae are normal. Pupils are equal, round, and reactive to light. Right eye exhibits no discharge. Left eye exhibits no discharge. No scleral icterus.  Neck: Normal range of motion. Neck supple. No tracheal deviation present. No thyromegaly present.  Cardiovascular: Normal rate, regular rhythm, normal heart sounds and intact distal pulses.  Exam reveals no gallop and no friction rub.   No murmur heard. Pulmonary/Chest: Effort normal and breath sounds normal. No accessory muscle usage. Not tachypneic. No respiratory distress. She has no decreased breath sounds. She has no wheezes. She has no rhonchi. She has no rales. She exhibits no tenderness.  Musculoskeletal: Normal range of motion. She exhibits no edema and no tenderness.  Lymphadenopathy:    She has no cervical adenopathy.  Neurological: She is alert and oriented to person, place, and time. No cranial nerve deficit. She exhibits normal muscle tone. Coordination normal.  Skin: Skin is warm and dry. No rash noted. She is not diaphoretic. No erythema. No pallor.  Psychiatric: Her speech is normal and behavior is normal. Judgment and thought content normal. She exhibits a depressed mood.          Assessment & Plan:

## 2013-01-17 ENCOUNTER — Telehealth: Payer: Self-pay | Admitting: Internal Medicine

## 2013-01-17 NOTE — Telephone Encounter (Signed)
We can have her decrease to 15mg   (1/2 tablet) at bedtime for one week, then stop.

## 2013-01-17 NOTE — Telephone Encounter (Signed)
Patient informed and verbalized understanding

## 2013-01-17 NOTE — Telephone Encounter (Signed)
Patient walked in the office she state she did not begin having these symptoms until she began taking this medication and on the instructions it states should not stop taking suddenly. Would like to know how she should stop taking this medication.

## 2013-01-17 NOTE — Telephone Encounter (Signed)
These would not be typical side effects of Remeron. She needs to be evaluated in a visit.

## 2013-01-17 NOTE — Telephone Encounter (Signed)
Spoke with patient she state she is feeling really shaky, at not she is not sleeping and dreaming horrible dreams, feel like she is having some hot flashes, then she can not have a BM without an enema or something.

## 2013-01-17 NOTE — Telephone Encounter (Signed)
Medication side effects with Mirtazapine. Has been taking x a few monhs.  Worked well at first but now symptoms x1 week causing trouble sleeping, shaking, sweating profusely at night.

## 2013-01-19 ENCOUNTER — Other Ambulatory Visit: Payer: Self-pay | Admitting: Internal Medicine

## 2013-01-20 NOTE — Telephone Encounter (Signed)
Eprescribed.

## 2013-02-03 ENCOUNTER — Ambulatory Visit (INDEPENDENT_AMBULATORY_CARE_PROVIDER_SITE_OTHER): Payer: Medicare Other | Admitting: Internal Medicine

## 2013-02-03 ENCOUNTER — Encounter: Payer: Self-pay | Admitting: Internal Medicine

## 2013-02-03 VITALS — BP 128/78 | HR 86 | Temp 98.1°F | Wt 135.0 lb

## 2013-02-03 DIAGNOSIS — F0391 Unspecified dementia with behavioral disturbance: Secondary | ICD-10-CM

## 2013-02-03 DIAGNOSIS — F411 Generalized anxiety disorder: Secondary | ICD-10-CM

## 2013-02-03 DIAGNOSIS — F0390 Unspecified dementia without behavioral disturbance: Secondary | ICD-10-CM

## 2013-02-03 DIAGNOSIS — F03918 Unspecified dementia, unspecified severity, with other behavioral disturbance: Secondary | ICD-10-CM

## 2013-02-03 DIAGNOSIS — R5381 Other malaise: Secondary | ICD-10-CM

## 2013-02-03 LAB — CBC WITH DIFFERENTIAL/PLATELET
Basophils Relative: 0 % (ref 0–1)
Eosinophils Absolute: 0 10*3/uL (ref 0.0–0.7)
Eosinophils Relative: 0 % (ref 0–5)
Hemoglobin: 13.6 g/dL (ref 12.0–15.0)
Lymphocytes Relative: 22 % (ref 12–46)
Lymphs Abs: 1.9 10*3/uL (ref 0.7–4.0)
MCV: 92.4 fL (ref 78.0–100.0)
Monocytes Absolute: 0.5 10*3/uL (ref 0.1–1.0)
Monocytes Relative: 7 % (ref 3–12)
Neutrophils Relative %: 71 % (ref 43–77)
Platelets: 235 10*3/uL (ref 150–400)
RBC: 4.23 MIL/uL (ref 3.87–5.11)
WBC: 8.3 10*3/uL (ref 4.0–10.5)

## 2013-02-03 LAB — COMPREHENSIVE METABOLIC PANEL
ALT: 15 U/L (ref 0–35)
Alkaline Phosphatase: 50 U/L (ref 39–117)
CO2: 29 mEq/L (ref 19–32)
Calcium: 9.4 mg/dL (ref 8.4–10.5)
Chloride: 101 mEq/L (ref 96–112)
Creat: 0.76 mg/dL (ref 0.50–1.10)
Glucose, Bld: 98 mg/dL (ref 70–99)
Sodium: 140 mEq/L (ref 135–145)
Total Protein: 6.6 g/dL (ref 6.0–8.3)

## 2013-02-03 LAB — VITAMIN B12: Vitamin B-12: 787 pg/mL (ref 211–911)

## 2013-02-03 LAB — TSH: TSH: 1.938 u[IU]/mL (ref 0.350–4.500)

## 2013-02-03 MED ORDER — MIRTAZAPINE 15 MG PO TBDP
15.0000 mg | ORAL_TABLET | Freq: Every day | ORAL | Status: DC
Start: 2013-02-03 — End: 2013-02-20

## 2013-02-03 NOTE — Patient Instructions (Signed)
Start Remeron 15mg  at bedtime.  We will check labs today.  Please increase your fluid intake with beverages such as gatorade, ginger ale.  Follow up next week.

## 2013-02-04 NOTE — Progress Notes (Signed)
Subjective:    Patient ID: Hannah Santos, female    DOB: 1930-11-30, 79 y.o.   MRN: [id number]  HPI 79 year old female with history of dementia presents for acute visit. Her daughter reports that over the last couple of days she seemed to be more confused and is often "jittery." She reports that her mother abruptly stopped taking her Remeron last week. Her daughter reports that initially on the Remeron she has done very well with improved appetite and improved sleep at night. However after increasing the dose to 30 mg her mother felt that the medication was making her too drowsy so she stopped the medication abruptly. Patient denies any focal symptoms such as chest pain, shortness of breath, fever, chills, change in bowel habits, change in appetite, dysuria, hematuria, flank pain, change in urinary frequency or urgency.  Patient has a long history of dementia and has been evaluated in clinic for this over the last 2 years. Her children have not been present during these visits. The patient has refused referral for neurocognitive testing and use of medications including Aricept and Namenda. Her daughter repeatedly requests today that we start her mother" Clonopin "or "Seroquel "to help improve her memory. She insists that "something must be done. "  Outpatient Prescriptions Prior to Visit  Medication Sig Dispense Refill  . acetaminophen (TYLENOL) 500 MG tablet Take 500 mg by mouth every 6 (six) hours as needed. 2 tablets       . B Complex-C (B-COMPLEX WITH VITAMIN C) tablet Take 1 tablet by mouth daily.        . Cholecalciferol (VITAMIN D3) 1000 UNITS CAPS Take 1 tablet by mouth daily.        . cyanocobalamin (,VITAMIN B-12,) 1000 MCG/ML injection Inject 1 mL (1,000 mcg total) into the muscle once.  10 mL  1  . donepezil (ARICEPT) 5 MG tablet Take 1 tablet (5 mg total) by mouth at bedtime as needed.  30 tablet  6  . DUREZOL 0.05 % EMUL Place 0.5 drops into the left eye daily.       Marland Kitchen  GLUCOSAMINE-CHONDROITIN PO Take 1 tablet by mouth daily.        Marland Kitchen loratadine (CLARITIN) 10 MG tablet Take 10 mg by mouth daily as needed.        Marland Kitchen LORazepam (ATIVAN) 0.5 MG tablet Take 1 tablet (0.5 mg total) by mouth every 8 (eight) hours as needed.  60 tablet  3  . Omega-3 Fatty Acids (FISH OIL) 1200 MG CAPS Take 1 capsule by mouth daily.        . Simethicone (GAS RELIEF PO) Take 2 tablets by mouth as needed.        . SYRINGE-NEEDLE, DISP, 3 ML (B-D INTEGRA SYRINGE) 25G X 5/8" 3 ML MISC Use with b12  50 each  0  . venlafaxine XR (EFFEXOR-XR) 150 MG 24 hr capsule Take 1 capsule (150 mg total) by mouth daily.  30 capsule  6  . venlafaxine XR (EFFEXOR-XR) 150 MG 24 hr capsule TAKE ONE CAPSULE BY MOUTH EVERY DAY  30 capsule  3  . mirtazapine (REMERON SOL-TAB) 30 MG disintegrating tablet Take 1 tablet (30 mg total) by mouth at bedtime.  30 tablet  3   No facility-administered medications prior to visit.   BP 128/78  Pulse 86  Temp(Src) 98.1 F (36.7 C) (Oral)  Wt 135 lb (61.236 kg)  BMI 21.8 kg/m2  SpO2 98%  Review of Systems  Constitutional: Negative for fever, chills,  appetite change, fatigue and unexpected weight change.  HENT: Negative for congestion, ear pain, sinus pressure, sore throat, trouble swallowing and voice change.   Eyes: Negative for visual disturbance.  Respiratory: Negative for cough, shortness of breath, wheezing and stridor.   Cardiovascular: Negative for chest pain, palpitations and leg swelling.  Gastrointestinal: Negative for nausea, vomiting, abdominal pain, diarrhea, constipation, blood in stool, abdominal distention and anal bleeding.  Genitourinary: Negative for dysuria and flank pain.  Musculoskeletal: Negative for arthralgias, gait problem, myalgias and neck pain.  Skin: Negative for color change and rash.  Neurological: Negative for dizziness and headaches.  Hematological: Negative for adenopathy. Does not bruise/bleed easily.  Psychiatric/Behavioral:  Positive for behavioral problems, confusion, decreased concentration and agitation. Negative for suicidal ideas, sleep disturbance and dysphoric mood. The patient is not nervous/anxious.        Objective:   Physical Exam  Constitutional: She is oriented to person, place, and time. She appears well-developed and well-nourished. No distress.  HENT:  Head: Normocephalic and atraumatic.  Right Ear: External ear normal.  Left Ear: External ear normal.  Nose: Nose normal.  Mouth/Throat: Oropharynx is clear and moist. No oropharyngeal exudate.  Eyes: Conjunctivae are normal. Pupils are equal, round, and reactive to light. Right eye exhibits no discharge. Left eye exhibits no discharge. No scleral icterus.  Neck: Normal range of motion. Neck supple. No tracheal deviation present. No thyromegaly present.  Cardiovascular: Normal rate, regular rhythm, normal heart sounds and intact distal pulses.  Exam reveals no gallop and no friction rub.   No murmur heard. Pulmonary/Chest: Effort normal and breath sounds normal. No accessory muscle usage. Not tachypneic. No respiratory distress. She has no decreased breath sounds. She has no wheezes. She has no rhonchi. She has no rales. She exhibits no tenderness.  Musculoskeletal: Normal range of motion. She exhibits no edema and no tenderness.  Lymphadenopathy:    She has no cervical adenopathy.  Neurological: She is alert and oriented to person, place, and time. No cranial nerve deficit. She exhibits normal muscle tone. Coordination normal.  Skin: Skin is warm and dry. No rash noted. She is not diaphoretic. No erythema. No pallor.  Psychiatric: She has a normal mood and affect. Her speech is normal and behavior is normal. Judgment and thought content normal. Cognition and memory are impaired. She exhibits abnormal recent memory and abnormal remote memory.          Assessment & Plan:

## 2013-02-04 NOTE — Assessment & Plan Note (Signed)
Long history of progressive memory loss. Recently appears to have had some episodes of delirium with dementia. Labs normal today. No urinary symptoms, but intermittent delirium may be caused by UTI, so recommended UA. Pt unable to give sample today, but took urine specimen cup with her to try at home.  Pt is moving from her home to assisted living. Encouraged pt daughter to come with her to visits (pt has never had family members with her at visits over last 2 years, and there appears to be some miscommunication between family and pt about her plan of care). Recommended that pt restart Remeron at 15mg  nightly to help with sleep and anxiety. Recommended referral to psychiatry, Dr. Maryruth Bun. Pt has declined this, but daughter will discuss with her further at home and call if pt wishes to proceed. We discussed progression of memory loss. Pt has refused Aricept and Namenda in the past, and I do not think these medications would be helpful at this point. Follow up 1 week and prn. Over of which >50% spent in face-to-face contact with patient discussing plan of care

## 2013-02-17 ENCOUNTER — Encounter: Payer: Self-pay | Admitting: *Deleted

## 2013-02-20 ENCOUNTER — Ambulatory Visit (INDEPENDENT_AMBULATORY_CARE_PROVIDER_SITE_OTHER): Payer: Medicare Other | Admitting: Internal Medicine

## 2013-02-20 ENCOUNTER — Encounter: Payer: Self-pay | Admitting: Internal Medicine

## 2013-02-20 VITALS — BP 138/74 | HR 95 | Temp 97.8°F | Wt 134.0 lb

## 2013-02-20 DIAGNOSIS — F411 Generalized anxiety disorder: Secondary | ICD-10-CM

## 2013-02-20 DIAGNOSIS — F329 Major depressive disorder, single episode, unspecified: Secondary | ICD-10-CM

## 2013-02-20 DIAGNOSIS — H6990 Unspecified Eustachian tube disorder, unspecified ear: Secondary | ICD-10-CM | POA: Insufficient documentation

## 2013-02-20 DIAGNOSIS — F0391 Unspecified dementia with behavioral disturbance: Secondary | ICD-10-CM

## 2013-02-20 DIAGNOSIS — H6983 Other specified disorders of Eustachian tube, bilateral: Secondary | ICD-10-CM

## 2013-02-20 DIAGNOSIS — H6980 Other specified disorders of Eustachian tube, unspecified ear: Secondary | ICD-10-CM | POA: Insufficient documentation

## 2013-02-20 MED ORDER — LORAZEPAM 0.5 MG PO TABS: 0.5000 mg | ORAL_TABLET | Freq: Three times a day (TID) | ORAL | Status: DC | PRN

## 2013-02-20 MED ORDER — VENLAFAXINE HCL ER 150 MG PO CP24: 150.0000 mg | ORAL_CAPSULE | Freq: Every day | ORAL | Status: AC

## 2013-02-20 MED ORDER — FLUTICASONE PROPIONATE 50 MCG/ACT NA SUSP: 2.0000 | Freq: Every day | NASAL | Status: AC

## 2013-02-20 NOTE — Assessment & Plan Note (Signed)
Symptoms consistent with bilateral eustachian tube dysfunction. Will start nasal steroid to see if any improvement. If no improvement, pt will call and we will set up referral to ENT for further evaluation.

## 2013-02-20 NOTE — Progress Notes (Signed)
Pre-visit discussion using our clinic review tool. No additional management support is needed unless otherwise documented below in the visit note.  

## 2013-02-20 NOTE — Assessment & Plan Note (Signed)
Symptoms well controlled with Effexor. Will continue.

## 2013-02-20 NOTE — Assessment & Plan Note (Signed)
Symptoms well controlled with Effexor and prn Lorazepam. Will continue.

## 2013-02-20 NOTE — Progress Notes (Signed)
Subjective:    Patient ID: Hannah Santos, female    DOB: 1930-05-08, 79 y.o.   MRN: [id number]  HPI 79 year old female with history of dementia, anxiety, depression presents for followup. She reports that she has been feeling much better recently as she recently completed her move out of her home. She is currently living with her brother and sister-in-law until she can move into assisted living. Symptoms of anxiety are well controlled with Effexor and lorazepam as needed. She tried to take mirtazapine but had shaking chills with this medication so stopped. Her sister and mom reports that she has been doing well in terms of her memory. She has been less confused recently.   Her only other concern today is bilateral ear pressure and occasional sensation of echo in her ears. She denies ear pain, nasal congestion, fever, chills.  Outpatient Encounter Prescriptions as of 02/20/2013  Medication Sig  . acetaminophen (TYLENOL) 500 MG tablet Take 500 mg by mouth every 6 (six) hours as needed. 2 tablets   . B Complex-C (B-COMPLEX WITH VITAMIN C) tablet Take 1 tablet by mouth daily.    . Cholecalciferol (VITAMIN D3) 1000 UNITS CAPS Take 1 tablet by mouth daily.    . cyanocobalamin (,VITAMIN B-12,) 1000 MCG/ML injection Inject 1 mL (1,000 mcg total) into the muscle once.  . donepezil (ARICEPT) 5 MG tablet Take 1 tablet (5 mg total) by mouth at bedtime as needed.  . DUREZOL 0.05 % EMUL Place 0.5 drops into the left eye daily.   Marland Kitchen GLUCOSAMINE-CHONDROITIN PO Take 1 tablet by mouth daily.    Marland Kitchen loratadine (CLARITIN) 10 MG tablet Take 10 mg by mouth daily as needed.    Marland Kitchen LORazepam (ATIVAN) 0.5 MG tablet Take 1 tablet (0.5 mg total) by mouth every 8 (eight) hours as needed.  . Omega-3 Fatty Acids (FISH OIL) 1200 MG CAPS Take 1 capsule by mouth daily.    . Simethicone (GAS RELIEF PO) Take 2 tablets by mouth as needed.    . SYRINGE-NEEDLE, DISP, 3 ML (B-D INTEGRA SYRINGE) 25G X 5/8" 3 ML MISC Use with b12  .  venlafaxine XR (EFFEXOR-XR) 150 MG 24 hr capsule Take 1 capsule (150 mg total) by mouth daily.   BP 138/74  Pulse 95  Temp(Src) 97.8 F (36.6 C) (Oral)  Wt 134 lb (60.782 kg)  SpO2 97%  Review of Systems  Constitutional: Positive for fatigue. Negative for fever, chills, appetite change and unexpected weight change.  HENT: Negative for congestion, ear pain, sinus pressure, sore throat, trouble swallowing and voice change.   Eyes: Negative for visual disturbance.  Respiratory: Negative for cough, shortness of breath, wheezing and stridor.   Cardiovascular: Negative for chest pain, palpitations and leg swelling.  Gastrointestinal: Negative for nausea, vomiting, abdominal pain, diarrhea, constipation, blood in stool, abdominal distention and anal bleeding.  Genitourinary: Negative for dysuria and flank pain.  Musculoskeletal: Negative for arthralgias, gait problem, myalgias and neck pain.  Skin: Negative for color change and rash.  Neurological: Negative for dizziness and headaches.  Hematological: Negative for adenopathy. Does not bruise/bleed easily.  Psychiatric/Behavioral: Positive for confusion, dysphoric mood and decreased concentration. Negative for suicidal ideas and sleep disturbance. The patient is nervous/anxious.        Objective:   Physical Exam  Constitutional: She is oriented to person, place, and time. She appears well-developed and well-nourished. No distress.  HENT:  Head: Normocephalic and atraumatic.  Right Ear: External ear normal.  Left Ear: External ear normal.  Nose: Nose normal.  Mouth/Throat: Oropharynx is clear and moist. No oropharyngeal exudate.  Eyes: Conjunctivae are normal. Pupils are equal, round, and reactive to light. Right eye exhibits no discharge. Left eye exhibits no discharge. No scleral icterus.  Neck: Normal range of motion. Neck supple. No tracheal deviation present. No thyromegaly present.  Cardiovascular: Normal rate, regular rhythm, normal  heart sounds and intact distal pulses.  Exam reveals no gallop and no friction rub.   No murmur heard. Pulmonary/Chest: Effort normal and breath sounds normal. No accessory muscle usage. Not tachypneic. No respiratory distress. She has no decreased breath sounds. She has no wheezes. She has no rhonchi. She has no rales. She exhibits no tenderness.  Musculoskeletal: Normal range of motion. She exhibits no edema and no tenderness.  Lymphadenopathy:    She has no cervical adenopathy.  Neurological: She is alert and oriented to person, place, and time. No cranial nerve deficit. She exhibits normal muscle tone. Coordination normal.  Skin: Skin is warm and dry. No rash noted. She is not diaphoretic. No erythema. No pallor.  Psychiatric: She has a normal mood and affect. Her behavior is normal. Judgment and thought content normal.          Assessment & Plan:

## 2013-02-20 NOTE — Assessment & Plan Note (Signed)
Symptoms reportedly improved with recent move. Will continue to monitor. Reviewed recent labs which were normal. Offered referral to psychiatry which pt declines.

## 2013-03-10 ENCOUNTER — Encounter: Payer: Self-pay | Admitting: *Deleted

## 2013-03-13 ENCOUNTER — Ambulatory Visit: Payer: Medicare Other | Admitting: Internal Medicine

## 2013-03-17 ENCOUNTER — Encounter: Payer: Self-pay | Admitting: *Deleted

## 2013-03-20 ENCOUNTER — Telehealth: Payer: Self-pay | Admitting: *Deleted

## 2013-03-20 ENCOUNTER — Ambulatory Visit (INDEPENDENT_AMBULATORY_CARE_PROVIDER_SITE_OTHER): Payer: Medicare Other | Admitting: Internal Medicine

## 2013-03-20 ENCOUNTER — Encounter: Payer: Self-pay | Admitting: Internal Medicine

## 2013-03-20 VITALS — BP 120/60 | HR 97 | Temp 98.2°F | Wt 132.0 lb

## 2013-03-20 DIAGNOSIS — K5909 Other constipation: Secondary | ICD-10-CM

## 2013-03-20 DIAGNOSIS — M1710 Unilateral primary osteoarthritis, unspecified knee: Secondary | ICD-10-CM

## 2013-03-20 DIAGNOSIS — F0391 Unspecified dementia with behavioral disturbance: Secondary | ICD-10-CM

## 2013-03-20 DIAGNOSIS — F411 Generalized anxiety disorder: Secondary | ICD-10-CM

## 2013-03-20 DIAGNOSIS — M170 Bilateral primary osteoarthritis of knee: Secondary | ICD-10-CM

## 2013-03-20 DIAGNOSIS — K5900 Constipation, unspecified: Secondary | ICD-10-CM

## 2013-03-20 NOTE — Telephone Encounter (Signed)
Yes, I agree that she should not be driving. I am also worried that she is not taking her medications correctly. I will place a home health referral to get a better assessment.

## 2013-03-20 NOTE — Progress Notes (Signed)
Subjective:    Patient ID: Hannah Santos, female    DOB: 1931-03-24, 79 y.o.   MRN: [id number]  HPI 79 year old female with history of dementia, ulcerative colitis, anxiety presents for followup. She reports feeling very anxious today because she forgot to take her medication this morning. She is also concerned about 2 days of constipation. She denies abdominal pain, nausea, vomiting. She has been taking a fiber supplement to help improve constipation however over the last couple of days has been unable to pass a bowel movement.  She has moved into a new assisted living facility and reports this has been a very stressful for her. She is still getting settled. She denies any other new concerns today.  Outpatient Encounter Prescriptions as of 03/20/2013  Medication Sig  . acetaminophen (TYLENOL) 500 MG tablet Take 500 mg by mouth every 6 (six) hours as needed. 2 tablets   . B Complex-C (B-COMPLEX WITH VITAMIN C) tablet Take 1 tablet by mouth daily.    . Cholecalciferol (VITAMIN D3) 1000 UNITS CAPS Take 1 tablet by mouth daily.    . cyanocobalamin (,VITAMIN B-12,) 1000 MCG/ML injection Inject 1 mL (1,000 mcg total) into the muscle once.  . DUREZOL 0.05 % EMUL Place 0.5 drops into the left eye daily.   Marland Kitchen GLUCOSAMINE-CHONDROITIN PO Take 1 tablet by mouth daily.    Marland Kitchen loratadine (CLARITIN) 10 MG tablet Take 10 mg by mouth daily as needed.    Marland Kitchen LORazepam (ATIVAN) 0.5 MG tablet Take 1 tablet (0.5 mg total) by mouth every 8 (eight) hours as needed.  . Omega-3 Fatty Acids (FISH OIL) 1200 MG CAPS Take 1 capsule by mouth daily.    . Simethicone (GAS RELIEF PO) Take 2 tablets by mouth as needed.    . SYRINGE-NEEDLE, DISP, 3 ML (B-D INTEGRA SYRINGE) 25G X 5/8" 3 ML MISC Use with b12  . venlafaxine XR (EFFEXOR-XR) 150 MG 24 hr capsule Take 1 capsule (150 mg total) by mouth daily.  Marland Kitchen donepezil (ARICEPT) 5 MG tablet Take 1 tablet (5 mg total) by mouth at bedtime as needed.  . fluticasone (FLONASE) 50  MCG/ACT nasal spray Place 2 sprays into both nostrils daily.   BP 120/60  Pulse 97  Temp(Src) 98.2 F (36.8 C) (Oral)  Wt 132 lb (59.875 kg)  SpO2 95%  Review of Systems  Constitutional: Positive for fatigue. Negative for fever, chills, appetite change and unexpected weight change.  HENT: Negative for congestion, ear pain, sinus pressure, sore throat, trouble swallowing and voice change.   Eyes: Negative for visual disturbance.  Respiratory: Negative for cough, shortness of breath, wheezing and stridor.   Cardiovascular: Negative for chest pain, palpitations and leg swelling.  Gastrointestinal: Positive for constipation. Negative for nausea, vomiting, abdominal pain, diarrhea, blood in stool, abdominal distention and anal bleeding.  Genitourinary: Negative for dysuria and flank pain.  Musculoskeletal: Negative for arthralgias, gait problem, myalgias and neck pain.  Skin: Negative for color change and rash.  Neurological: Negative for dizziness and headaches.  Hematological: Negative for adenopathy. Does not bruise/bleed easily.  Psychiatric/Behavioral: Negative for suicidal ideas, sleep disturbance and dysphoric mood. The patient is nervous/anxious.        Objective:   Physical Exam  Constitutional: She is oriented to person, place, and time. She appears well-developed and well-nourished. No distress.  HENT:  Head: Normocephalic and atraumatic.  Right Ear: External ear normal.  Left Ear: External ear normal.  Nose: Nose normal.  Mouth/Throat: Oropharynx is clear and moist.  No oropharyngeal exudate.  Eyes: Conjunctivae are normal. Pupils are equal, round, and reactive to light. Right eye exhibits no discharge. Left eye exhibits no discharge. No scleral icterus.  Neck: Normal range of motion. Neck supple. No tracheal deviation present. No thyromegaly present.  Cardiovascular: Normal rate, regular rhythm, normal heart sounds and intact distal pulses.  Exam reveals no gallop and no  friction rub.   No murmur heard. Pulmonary/Chest: Effort normal and breath sounds normal. No accessory muscle usage. Not tachypneic. No respiratory distress. She has no decreased breath sounds. She has no wheezes. She has no rhonchi. She has no rales. She exhibits no tenderness.  Abdominal: Soft. Bowel sounds are normal. She exhibits no distension. There is no tenderness.  Musculoskeletal: Normal range of motion. She exhibits no edema and no tenderness.  Lymphadenopathy:    She has no cervical adenopathy.  Neurological: She is alert and oriented to person, place, and time. No cranial nerve deficit. She exhibits normal muscle tone. Coordination normal.  Skin: Skin is warm and dry. No rash noted. She is not diaphoretic. No erythema. No pallor.  Psychiatric: Her speech is normal and behavior is normal. Judgment and thought content normal. Her mood appears anxious. She exhibits abnormal recent memory.          Assessment & Plan:

## 2013-03-20 NOTE — Assessment & Plan Note (Signed)
Recent worsening of anxiety. Pt forgot to take her medication today. Question if she has been compliant with medications on a regular basis at home. Will touch base with her daughter to see if family can help monitor.

## 2013-03-20 NOTE — Telephone Encounter (Signed)
That is fine 

## 2013-03-20 NOTE — Assessment & Plan Note (Signed)
Progressive symptoms of memory loss and confusion. Will send a message to her daughter via nursing staff. It would be helpful to have a family member with her at visits.

## 2013-03-20 NOTE — Assessment & Plan Note (Signed)
Symptoms of constipation despite recent use of fiber supplement. Encouraged her to add Miralax 17gm daily to help soften stool. Encouraged her to keep follow up with GI, given her h/o ulcerative colitis. If no BM in next 24-48hr, we discussed using Fleet's enema.

## 2013-03-20 NOTE — Telephone Encounter (Signed)
He has a program at [facility] and this patient scored high on the fall risk scale. Think it would be beneficial for her to participate in his program. And in order for her to be able to do so he will need a prescription to evaluate and treat for gait issues. This can be faxed to [phone]

## 2013-03-20 NOTE — Patient Instructions (Signed)
Start Miralax 17gm (one capful) mixed in a beverage daily to help soften stools. You may increase to twice daily if needed.  Call if continued problems with constipation.

## 2013-03-20 NOTE — Telephone Encounter (Signed)
Message copied by Dema Severin on Mon Mar 20, 2013  2:27 PM ------      Message from: Ronna Polio A      Created: Mon Mar 20, 2013 10:58 AM       Can you try to get in touch with her daughter, Eunice Blase? Ms. Tebbetts came to clinic for follow up today, but was very confused. She continues to drive, and likely should not be driving given recent confusion. I want to make sure that they are checking on her in her new home at [facility]. ------

## 2013-03-20 NOTE — Telephone Encounter (Signed)
Left message, notfiying pt's daughter, Reece Levy

## 2013-03-20 NOTE — Progress Notes (Signed)
Pre-visit discussion using our clinic review tool. No additional management support is needed unless otherwise documented below in the visit note.  

## 2013-03-20 NOTE — Telephone Encounter (Signed)
Spoke with pt's daughter, Eunice Blase. She is also very concerned with her mother's confusion. States there was a family uproar when she tried to stop her from driving, that the rest of the family feels she is fine to drive. Wants to know if there is a form that can be completed for the DMV to inhibit her from driving? She checks in on the patient daily, worries with her living in an assisted living that she is not eating, increased confusion. She said she tried to come to the appointment with pt today, but pt declined. Eunice Blase thinks the pt is suspicious she is going to speak to Dr. Dan Humphreys without her knowing.

## 2013-03-23 NOTE — Telephone Encounter (Signed)
Prescription faxed

## 2013-03-24 ENCOUNTER — Other Ambulatory Visit (INDEPENDENT_AMBULATORY_CARE_PROVIDER_SITE_OTHER): Payer: Medicare Other

## 2013-03-24 DIAGNOSIS — N390 Urinary tract infection, site not specified: Secondary | ICD-10-CM

## 2013-03-24 LAB — POCT URINALYSIS DIPSTICK
Bilirubin, UA: NEGATIVE
Blood, UA: NEGATIVE
Nitrite, UA: NEGATIVE
Protein, UA: NEGATIVE
Urobilinogen, UA: 0.2
pH, UA: 7.5

## 2013-03-26 LAB — URINE CULTURE: Colony Count: >=100000

## 2013-03-31 ENCOUNTER — Telehealth: Payer: Self-pay | Admitting: *Deleted

## 2013-03-31 MED ORDER — SULFAMETHOXAZOLE-TRIMETHOPRIM 800-160 MG PO TABS: 1.0000 | ORAL_TABLET | Freq: Two times a day (BID) | ORAL | Status: AC

## 2013-03-31 NOTE — Telephone Encounter (Signed)
Message copied by Theola Sequin on Fri Mar 31, 2013  8:57 AM ------      Message from: Ronna Polio A      Created: Mon Mar 27, 2013  7:05 AM       Urine culture grew a bacteria that is usually a colonizer not a pathogen, however given high counts and her recent problems with confusion, I would like to treat. I would recommend Bactrim DS po bid x 7 days. We should repeat a culture once this is complete. ------

## 2013-03-31 NOTE — Telephone Encounter (Signed)
Patient informed and verbalized understanding. Per patient she will call her daughter to pick up medication from pharmacy.

## 2013-04-01 ENCOUNTER — Other Ambulatory Visit: Payer: Self-pay | Admitting: Internal Medicine

## 2013-04-03 ENCOUNTER — Other Ambulatory Visit: Payer: Self-pay | Admitting: Internal Medicine

## 2013-05-03 ENCOUNTER — Ambulatory Visit: Payer: Medicare Other | Admitting: Adult Health

## 2013-05-10 ENCOUNTER — Telehealth: Payer: Self-pay | Admitting: Internal Medicine

## 2013-05-10 NOTE — Telephone Encounter (Signed)
Merry Proud called to set up PT for Hannah Santos His number [phone]

## 2013-05-10 NOTE — Telephone Encounter (Signed)
Spoke with Hannah Santos and he stated he was set up for outpatient therapy. However she has homehealth and she can not have both. Homehealth supposed to be discharging her this week, he would like a new order for outpatient therapy if you are ok with this.

## 2013-05-10 NOTE — Telephone Encounter (Signed)
That is fine 

## 2013-05-12 NOTE — Telephone Encounter (Signed)
Would like this faxed to [phone]

## 2013-05-17 ENCOUNTER — Other Ambulatory Visit: Payer: Self-pay | Admitting: Internal Medicine

## 2013-05-30 ENCOUNTER — Ambulatory Visit: Payer: Medicare Other | Admitting: Adult Health

## 2013-06-19 ENCOUNTER — Ambulatory Visit: Payer: Medicare Other | Admitting: Internal Medicine

## 2013-06-29 ENCOUNTER — Ambulatory Visit: Payer: Medicare Other | Admitting: Internal Medicine

## 2013-11-20 ENCOUNTER — Emergency Department: Payer: Self-pay | Admitting: Student

## 2013-11-20 LAB — CBC WITH DIFFERENTIAL/PLATELET
Basophil #: 0 10*3/uL (ref 0.0–0.1)
Basophil %: 0.2 %
Eosinophil #: 0.1 10*3/uL (ref 0.0–0.7)
Eosinophil %: 0.9 %
HCT: 35.7 % (ref 35.0–47.0)
HGB: 11.6 g/dL — AB (ref 12.0–16.0)
Lymphocyte #: 1.6 10*3/uL (ref 1.0–3.6)
Lymphocyte %: 26 %
MCH: 30.3 pg (ref 26.0–34.0)
MCHC: 32.5 g/dL (ref 32.0–36.0)
MCV: 93 fL (ref 80–100)
MONO ABS: 0.4 x10 3/mm (ref 0.2–0.9)
Monocyte %: 7.1 %
Neutrophil #: 4.1 10*3/uL (ref 1.4–6.5)
Neutrophil %: 65.8 %
Platelet: 211 10*3/uL (ref 150–440)
RBC: 3.82 10*6/uL (ref 3.80–5.20)
RDW: 12.4 % (ref 11.5–14.5)
WBC: 6.3 10*3/uL (ref 3.6–11.0)

## 2013-11-20 LAB — URINALYSIS, COMPLETE
Bacteria: NONE SEEN
Bilirubin,UR: NEGATIVE
Blood: NEGATIVE
Glucose,UR: NEGATIVE mg/dL (ref 0–75)
Ketone: NEGATIVE
Leukocyte Esterase: NEGATIVE
NITRITE: NEGATIVE
PROTEIN: NEGATIVE
Ph: 6 (ref 4.5–8.0)
RBC,UR: <1 /HPF (ref 0–5)
SPECIFIC GRAVITY: 1.004 (ref 1.003–1.030)
Squamous Epithelial: <1

## 2013-11-20 LAB — BASIC METABOLIC PANEL
Anion Gap: 10 (ref 7–16)
BUN: 9 mg/dL (ref 7–18)
CO2: 27 mmol/L (ref 21–32)
CREATININE: 0.95 mg/dL (ref 0.60–1.30)
Calcium, Total: 8.8 mg/dL (ref 8.5–10.1)
Chloride: 107 mmol/L (ref 98–107)
GFR CALC NON AF AMER: 57 — AB
Glucose: 113 mg/dL — ABNORMAL HIGH (ref 65–99)
OSMOLALITY: 286 (ref 275–301)
Potassium: 3.6 mmol/L (ref 3.5–5.1)
SODIUM: 144 mmol/L (ref 136–145)

## 2013-11-20 LAB — HEPATIC FUNCTION PANEL A (ARMC)
ALT: 13 U/L — AB
Albumin: 3.3 g/dL — ABNORMAL LOW (ref 3.4–5.0)
Alkaline Phosphatase: 94 U/L
Bilirubin, Direct: <0.1 mg/dL (ref 0.00–0.20)
Bilirubin,Total: 0.3 mg/dL (ref 0.2–1.0)
SGOT(AST): 15 U/L (ref 15–37)
Total Protein: 7 g/dL (ref 6.4–8.2)

## 2013-11-20 LAB — TROPONIN I: Troponin-I: <0.02 ng/mL

## 2015-01-05 DEATH — deceased

## 2015-10-08 IMAGING — CT CT HEAD WITHOUT CONTRAST
1 series · 15 of 30 positions shown, 19 images · non-contrast
Comparison: Prior CT from 08/07/2012

CLINICAL DATA: Weakness

EXAM:
CT HEAD WITHOUT CONTRAST
TECHNIQUE: Contiguous axial images were obtained from the base of the skull
through the vertex without intravenous contrast.

[Series 2: head wo · axial · 0.41mm/px · z∈[-117,+27]mm · 15 of 36 slices shown, 19 images]
[im 2/36  brain]
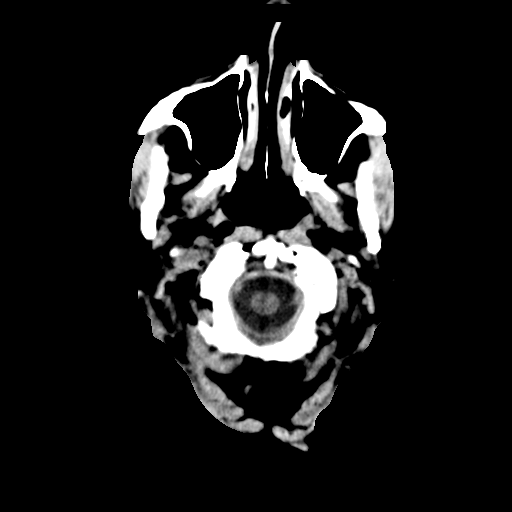
[im 2/36  bone]
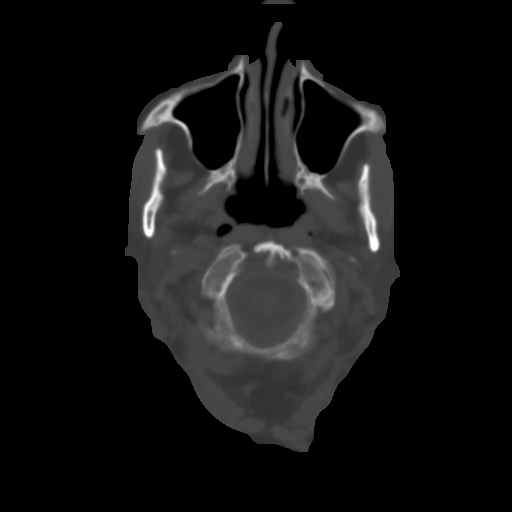
[im 4/36  brain]
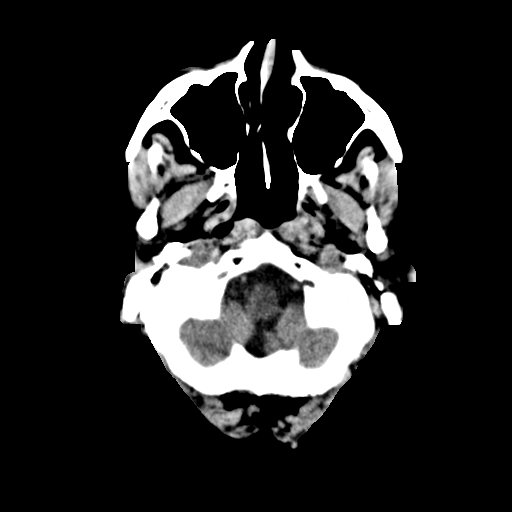
[im 7/36  brain]
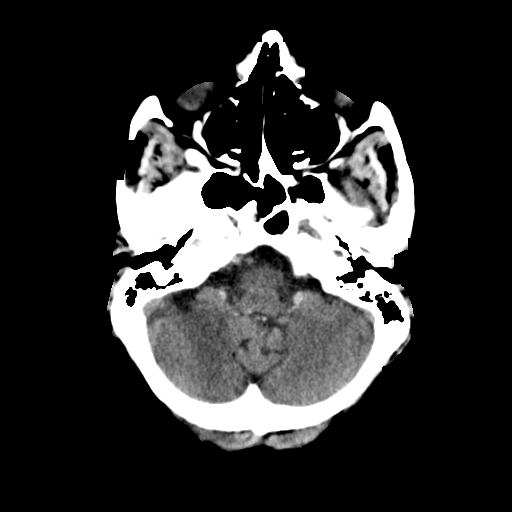
[im 9/36  brain]
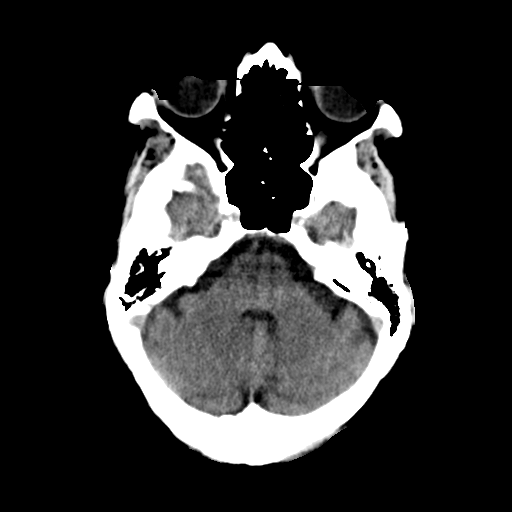
[im 11/36  brain]
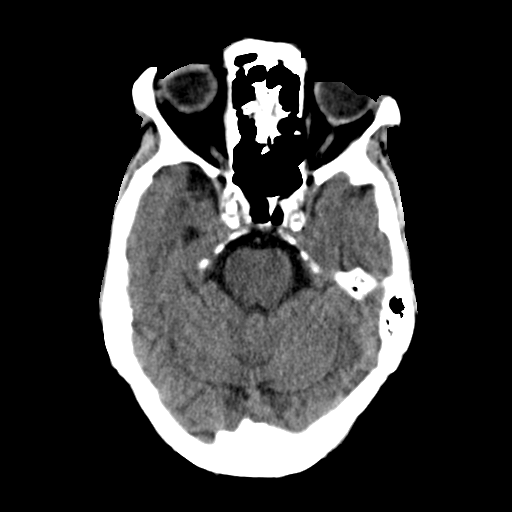
[im 11/36  bone]
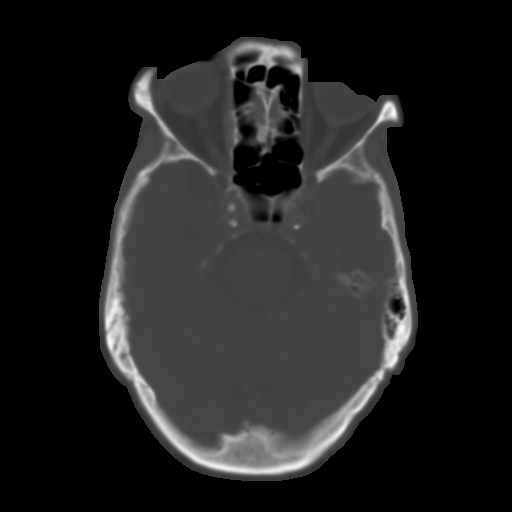
[im 14/36  brain]
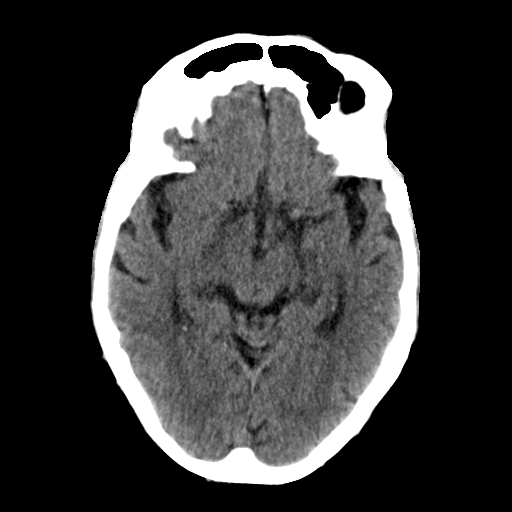
[im 16/36  brain]
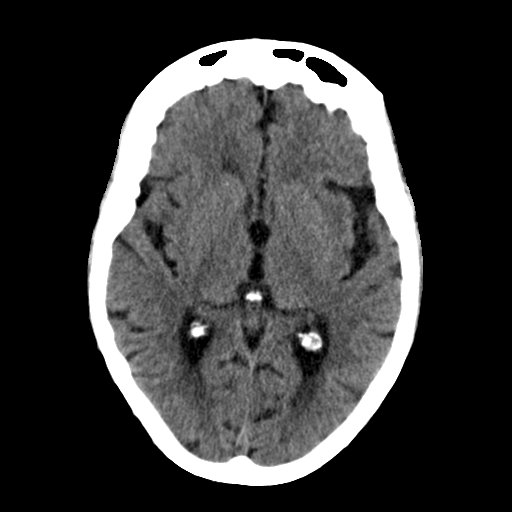
[im 19/36  brain]
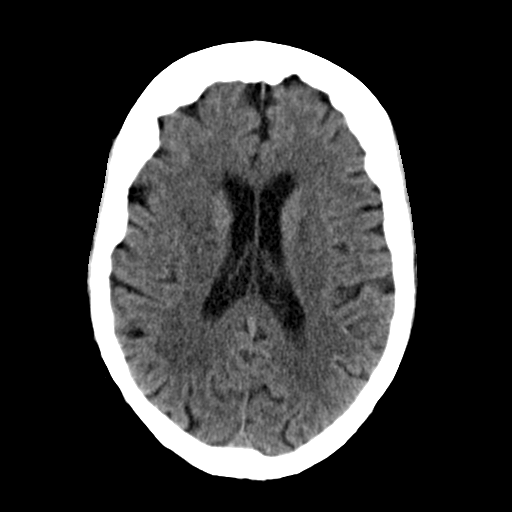
[im 20/36  brain]
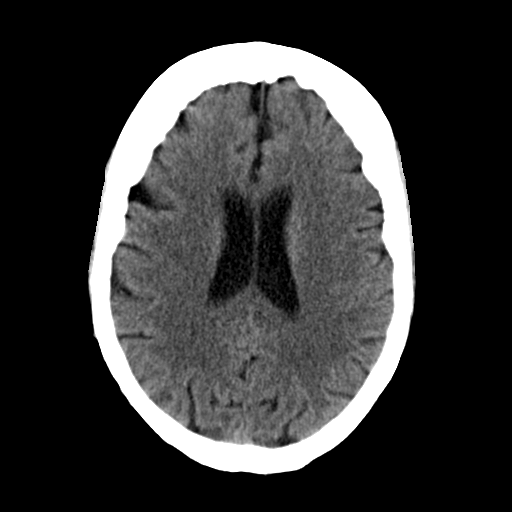
[im 20/36  bone]
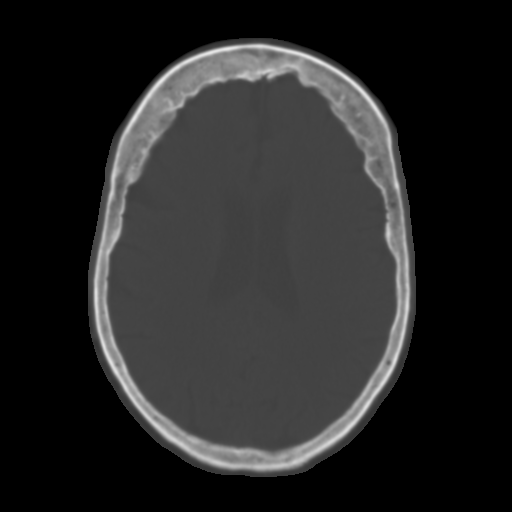
[im 22/36  brain]
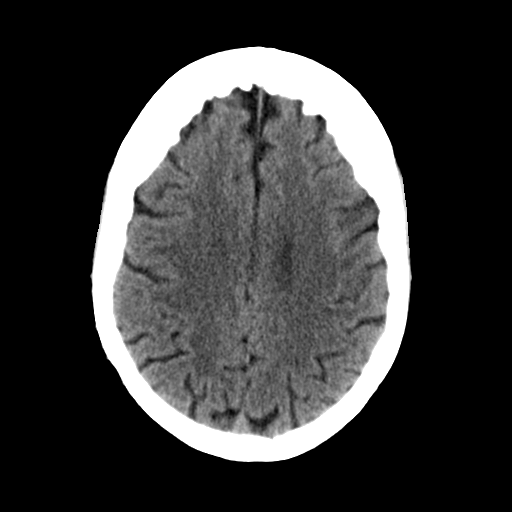
[im 25/36  brain]
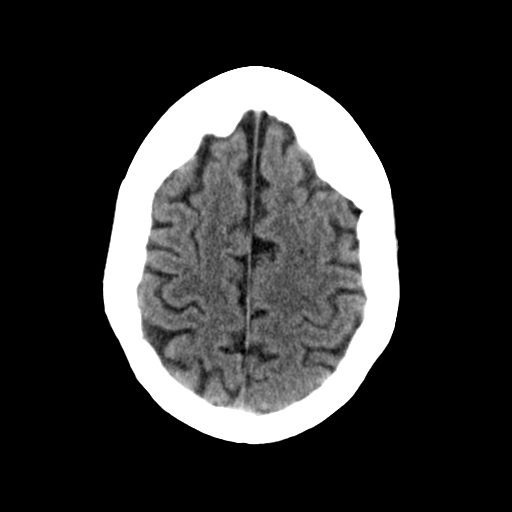
[im 27/36  brain]
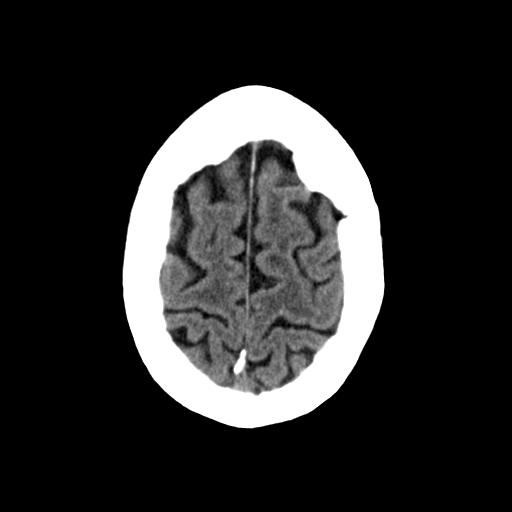
[im 29/36  brain]
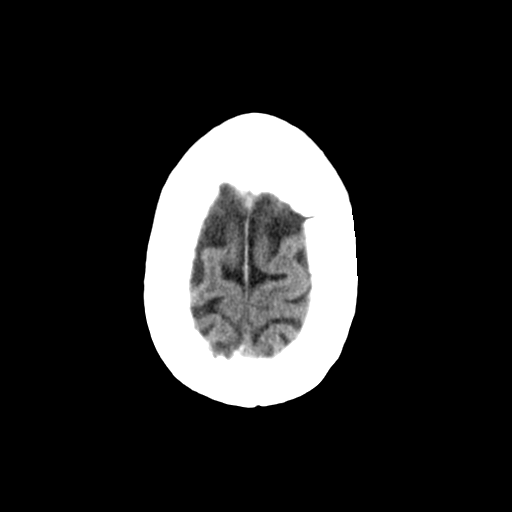
[im 29/36  bone]
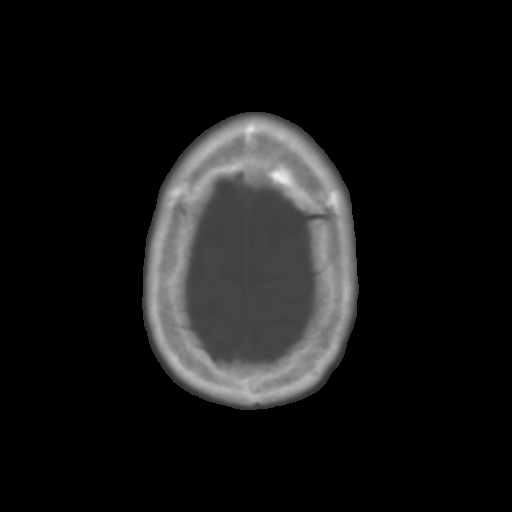
[im 32/36  brain]
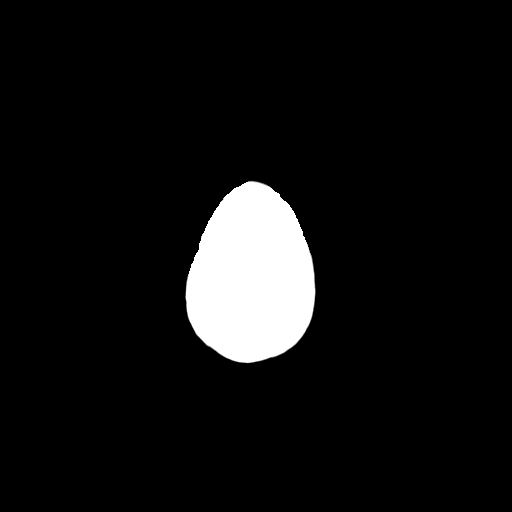
[im 34/36  brain]
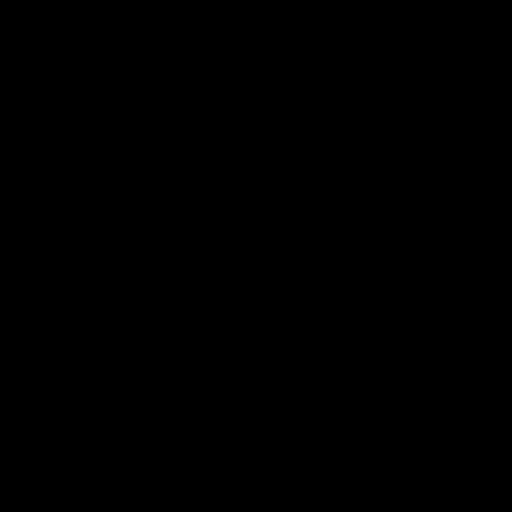

[15 of 30 positions shown; findings below may reference images not displayed]

FINDINGS: Diffuse prominence of the CSF containing spaces is compatible with
generalized cerebral atrophy. Scattered and confluent hypodensity
within the periventricular and deep white matter both cerebral
hemispheres is most consistent with moderate chronic small vessel
disease, stable from prior.

There is no acute intracranial hemorrhage or infarct. No mass lesion
or midline shift. Gray-white matter differentiation is well
maintained. Ventricles are normal in size without evidence of
hydrocephalus. CSF containing spaces are within normal limits. No
extra-axial fluid collection.

The calvarium is intact.

Orbital soft tissues are within normal limits.

The paranasal sinuses and mastoid air cells are well pneumatized and
free of fluid.

Scalp soft tissues are unremarkable.
IMPRESSION: 1. No acute intracranial abnormality.
2. Generalized atrophy with moderate chronic small vessel ischemic
disease.
